# Patient Record
Sex: Male | Born: 1995 | Race: Black or African American | Hispanic: No | Marital: Married | Smoking: Current some day smoker
Health system: Southern US, Community
[De-identification: ages and names within clinical notes are randomized; demographics above are authoritative.]

## PROBLEM LIST (undated history)

## (undated) DIAGNOSIS — E119 Type 2 diabetes mellitus without complications: Secondary | ICD-10-CM

## (undated) DIAGNOSIS — N200 Calculus of kidney: Secondary | ICD-10-CM

## (undated) DIAGNOSIS — J45909 Unspecified asthma, uncomplicated: Secondary | ICD-10-CM

## (undated) HISTORY — DX: Type 2 diabetes mellitus without complications: E11.9

---
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
## 2013-01-04 NOTE — ED Provider Notes (Signed)
Medical screening examination/treatment/procedure(s) were performed by non-physician practitioner and as supervising physician I was immediately available for consultation/collaboration.    Jeannifer Drakeford R Kitrina Maurin, MD 01/04/13 1605 

## 2013-09-12 ENCOUNTER — Emergency Department (HOSPITAL_COMMUNITY): Payer: Medicaid Other

## 2013-09-12 ENCOUNTER — Emergency Department (HOSPITAL_COMMUNITY)
Admission: EM | Admit: 2013-09-12 | Discharge: 2013-09-12 | Disposition: A | Discharge status: Home / Self Care | Payer: Medicaid Other | Attending: Emergency Medicine | Admitting: Emergency Medicine

## 2013-09-12 ENCOUNTER — Encounter (HOSPITAL_COMMUNITY): Payer: Self-pay | Admitting: Emergency Medicine

## 2013-09-12 DIAGNOSIS — Y9361 Activity, american tackle football: Secondary | ICD-10-CM | POA: Diagnosis not present

## 2013-09-12 DIAGNOSIS — S99919A Unspecified injury of unspecified ankle, initial encounter: Secondary | ICD-10-CM | POA: Diagnosis present

## 2013-09-12 DIAGNOSIS — S99929A Unspecified injury of unspecified foot, initial encounter: Secondary | ICD-10-CM

## 2013-09-12 DIAGNOSIS — W03XXXA Other fall on same level due to collision with another person, initial encounter: Secondary | ICD-10-CM | POA: Insufficient documentation

## 2013-09-12 DIAGNOSIS — S93402A Sprain of unspecified ligament of left ankle, initial encounter: Secondary | ICD-10-CM

## 2013-09-12 DIAGNOSIS — S8990XA Unspecified injury of unspecified lower leg, initial encounter: Secondary | ICD-10-CM | POA: Diagnosis present

## 2013-09-12 DIAGNOSIS — Y92838 Other recreation area as the place of occurrence of the external cause: Secondary | ICD-10-CM

## 2013-09-12 DIAGNOSIS — S93409A Sprain of unspecified ligament of unspecified ankle, initial encounter: Secondary | ICD-10-CM | POA: Diagnosis not present

## 2013-09-12 DIAGNOSIS — X500XXA Overexertion from strenuous movement or load, initial encounter: Secondary | ICD-10-CM | POA: Insufficient documentation

## 2013-09-12 DIAGNOSIS — Y9239 Other specified sports and athletic area as the place of occurrence of the external cause: Secondary | ICD-10-CM | POA: Insufficient documentation

## 2013-09-12 DIAGNOSIS — J45909 Unspecified asthma, uncomplicated: Secondary | ICD-10-CM | POA: Diagnosis not present

## 2013-09-12 MED ORDER — IBUPROFEN 800 MG PO TABS: 800.0000 mg | ORAL_TABLET | Freq: Three times a day (TID) | ORAL | Status: DC

## 2013-09-12 MED ORDER — IBUPROFEN 400 MG PO TABS
800.0000 mg | ORAL_TABLET | Freq: Once | ORAL | Status: AC
  Administered 2013-09-12: 800 mg via ORAL
  Filled 2013-09-12: qty 4

## 2013-09-12 NOTE — Discharge Instructions (Signed)
Ankle Sprain  An ankle sprain is an injury to the strong, fibrous tissues (ligaments) that hold your ankle bones together.   HOME CARE   · Put ice on your ankle for 1-2 days or as told by your doctor.  ¨ Put ice in a plastic bag.  ¨ Place a towel between your skin and the bag.  ¨ Leave the ice on for 15-20 minutes at a time, every 2 hours while you are awake.  · Only take medicine as told by your doctor.  · Raise (elevate) your injured ankle above the level of your heart as much as possible for 2-3 days.  · Use crutches if your doctor tells you to. Slowly put your own weight on the affected ankle. Use the crutches until you can walk without pain.  · If you have a plaster splint:  ¨ Do not rest it on anything harder than a pillow for 24 hours.  ¨ Do not put weight on it.  ¨ Do not get it wet.  ¨ Take it off to shower or bathe.  · If given, use an elastic wrap or support stocking for support. Take the wrap off if your toes lose feeling (numb), tingle, or turn cold or blue.  · If you have an air splint:  ¨ Add or let out air to make it comfortable.  ¨ Take it off at night and to shower and bathe.  ¨ Wiggle your toes and move your ankle up and down often while you are wearing it.  GET HELP IF:  · You have rapidly increasing bruising or puffiness (swelling).  · Your toes feel very cold.  · You lose feeling in your foot.  · Your medicine does not help your pain.  GET HELP RIGHT AWAY IF:   · Your toes lose feeling (numb) or turn blue.  · You have severe pain that is increasing.  MAKE SURE YOU:   · Understand these instructions.  · Will watch your condition.  · Will get help right away if you are not doing well or get worse.  Document Released: 06/12/2007 Document Revised: 05/10/2013 Document Reviewed: 07/08/2011  ExitCare® Patient Information ©2015 [employer]. This information is not intended to replace advice given to you by your health care provider. Make sure you discuss any questions you have with your health care  provider.

## 2013-09-12 NOTE — ED Notes (Signed)
He was playing football this afternoon and got tackled and felt a pop and pain in his L ankle/foot. Bruising and swelling to L inner foot. States pain is worse with ambulation. Cms intact

## 2013-09-12 NOTE — ED Provider Notes (Signed)
CSN: [id number]     Arrival date & time 09/12/13  1759 History   First MD Initiated Contact with Patient 09/12/13 2029   This chart was scribed for non-physician practitioner Madelyn Flavors, PA-C,  working with Vanetta Mulders, MD by Gwenevere Abbot, ED scribe. This patient was seen in room TR10C/TR10C and the patient's care was started at 9:25 PM.    Chief Complaint  Patient presents with  . Foot Injury   The history is provided by the patient. No language interpreter was used.   HPI Comments:  Steven Barrera is a 18 y.o. male who presents to the Emergency Department complaining of a left foot and ankle injury. Pt reports that he was playing football, and that he was tackled. The player then fell on his foot, and he heard a pop in his ankle. Pt denies taking OTC medication PTA. Pt denies tingling or numbness, but is experiencing discoloration and swelling. Pt reports that he has previously injured the left foot. Pt reports that he is healthy. Pt denies allergy. All other ROS negative.  Past Medical History  Diagnosis Date  . Asthma    History reviewed. No pertinent past surgical history. History reviewed. No pertinent family history. History  Substance Use Topics  . Smoking status: Never Smoker   . Smokeless tobacco: Not on file  . Alcohol Use: No    Review of Systems  Musculoskeletal: Positive for arthralgias, joint swelling and myalgias.  All other systems reviewed and are negative.     Allergies  Shellfish allergy  Home Medications   Prior to Admission medications   Medication Sig Start Date End Date Taking? Authorizing Provider  ibuprofen (ADVIL,MOTRIN) 800 MG tablet Take 1 tablet (800 mg total) by mouth 3 (three) times daily. 09/12/13   Giana Castner A Forcucci, PA-C   BP 105/47  Pulse 84  Temp(Src) 98.2 F (36.8 C) (Oral)  Resp 18  SpO2 98% Physical Exam  Nursing note and vitals reviewed. Constitutional: He is oriented to person, place, and time. He appears  well-developed and well-nourished.  HENT:  Head: Normocephalic and atraumatic.  Mouth/Throat: Oropharynx is clear and moist. No oropharyngeal exudate.  Eyes: Conjunctivae and EOM are normal. Pupils are equal, round, and reactive to light. No scleral icterus.  Neck: Normal range of motion. Neck supple.  Cardiovascular: Normal rate, regular rhythm, normal heart sounds and intact distal pulses.  Exam reveals no gallop and no friction rub.   No murmur heard. Pulmonary/Chest: Effort normal and breath sounds normal. No respiratory distress. He has no wheezes. He has no rales. He exhibits no tenderness.  Musculoskeletal: Normal range of motion. He exhibits tenderness.       Left ankle: He exhibits normal range of motion, no swelling, no ecchymosis, no deformity, no laceration and normal pulse. Tenderness (ATF). AITFL tenderness found. No lateral malleolus, no medial malleolus, no CF ligament, no posterior TFL, no head of 5th metatarsal and no proximal fibula tenderness found. Achilles tendon normal.  Neurological: He is alert and oriented to person, place, and time.  Skin: Skin is warm and dry.  Psychiatric: He has a normal mood and affect. His behavior is normal. Judgment and thought content normal.    ED Course  Procedures  DIAGNOSTIC STUDIES: Oxygen Saturation is 98% on RA, normal by my interpretation.  COORDINATION OF CARE: 9:31 PM-Discussed treatment plan which includes ankle splint and crutches, along with anti- inflammation medication with pt at bedside and pt agreed to plan.   Labs Review Labs  Reviewed - No data to display  Imaging Review Dg Ankle Complete Left  09/12/2013   CLINICAL DATA:  Football injury.  Ankle pain.  EXAM: LEFT ANKLE COMPLETE - 3+ VIEW  COMPARISON:  01/23/2007  FINDINGS: There is no evidence of fracture, dislocation, or joint effusion. There is no evidence of arthropathy or other focal bone abnormality. Soft tissues are unremarkable.  IMPRESSION: Negative.    Electronically Signed   By: Myles Rosenthal M.D.   On: 09/12/2013 20:17   Dg Foot Complete Left  09/12/2013   CLINICAL DATA:  Injury.  EXAM: LEFT FOOT - COMPLETE 3+ VIEW  COMPARISON:  None.  FINDINGS: There is no evidence of fracture or dislocation. There is no evidence of arthropathy or other focal bone abnormality. Soft tissues are unremarkable.  IMPRESSION: Negative.   Electronically Signed   By: Elberta Fortis M.D.   On: 09/12/2013 20:16     EKG Interpretation None      MDM   Final diagnoses:  Left ankle sprain, initial encounter   Patient is a 18 y.o. Male who presents to the ED with left ankle pain after inversion.  Physical exam reveals ligamentous tenderness.  Plain films negative for fracture.  Will treat with naproxen BID.  Patient to be placed in ASO and given crutches to weight bear as tolerated.  Patient was told to follow-up with ortho.  Patient to return for compartment syndrome symptoms.  He states understanding and agreement at this time.   I personally performed the services described in this documentation, which was scribed in my presence. The recorded information has been reviewed and is accurate.     Eben Burow, PA-C 09/13/13 [phone]

## 2013-09-16 NOTE — ED Provider Notes (Signed)
Medical screening examination/treatment/procedure(s) were performed by non-physician practitioner and as supervising physician I was immediately available for consultation/collaboration.   EKG Interpretation None        Akela Pocius, MD 09/16/13 0735 

## 2014-06-03 ENCOUNTER — Encounter (HOSPITAL_COMMUNITY): Payer: Self-pay

## 2014-06-03 ENCOUNTER — Emergency Department (HOSPITAL_COMMUNITY)
Admission: EM | Admit: 2014-06-03 | Discharge: 2014-06-03 | Disposition: A | Discharge status: Home / Self Care | Payer: Medicaid Other | Attending: Emergency Medicine | Admitting: Emergency Medicine

## 2014-06-03 DIAGNOSIS — H578 Other specified disorders of eye and adnexa: Secondary | ICD-10-CM | POA: Diagnosis not present

## 2014-06-03 DIAGNOSIS — Z791 Long term (current) use of non-steroidal anti-inflammatories (NSAID): Secondary | ICD-10-CM | POA: Insufficient documentation

## 2014-06-03 DIAGNOSIS — J45901 Unspecified asthma with (acute) exacerbation: Secondary | ICD-10-CM | POA: Diagnosis not present

## 2014-06-03 DIAGNOSIS — H938X1 Other specified disorders of right ear: Secondary | ICD-10-CM | POA: Insufficient documentation

## 2014-06-03 DIAGNOSIS — B9789 Other viral agents as the cause of diseases classified elsewhere: Secondary | ICD-10-CM

## 2014-06-03 DIAGNOSIS — J069 Acute upper respiratory infection, unspecified: Secondary | ICD-10-CM | POA: Insufficient documentation

## 2014-06-03 DIAGNOSIS — R05 Cough: Secondary | ICD-10-CM | POA: Diagnosis present

## 2014-06-03 DIAGNOSIS — H109 Unspecified conjunctivitis: Secondary | ICD-10-CM | POA: Diagnosis not present

## 2014-06-03 MED ORDER — POLYMYXIN B-TRIMETHOPRIM 10000-0.1 UNIT/ML-% OP SOLN: 1.0000 [drp] | OPHTHALMIC | Status: DC

## 2014-06-03 MED ORDER — PSEUDOEPH-BROMPHEN-DM 30-2-10 MG/5ML PO SYRP: 5.0000 mL | ORAL_SOLUTION | Freq: Three times a day (TID) | ORAL | Status: DC | PRN

## 2014-06-03 NOTE — ED Notes (Signed)
Pt called x2. No answer.  

## 2014-06-03 NOTE — ED Notes (Signed)
Pt reports he has been coughing up a lot of yellow phlegm since Sunday.

## 2014-06-03 NOTE — ED Notes (Signed)
The pt states he did not hear us calling his name for triage earlier and returned to nurse first desk asking about the wait. Staff called the patient multiple times and did not see him in the waiting room.

## 2014-06-03 NOTE — ED Notes (Signed)
Called for triage no response 

## 2014-06-03 NOTE — Discharge Instructions (Signed)
Upper Respiratory Infection, Adult °An upper respiratory infection (URI) is also sometimes known as the common cold. The upper respiratory tract includes the nose, sinuses, throat, trachea, and bronchi. Bronchi are the airways leading to the lungs. Most people improve within 1 week, but symptoms can last up to 2 weeks. A residual cough may last even longer.  °CAUSES °Many different viruses can infect the tissues lining the upper respiratory tract. The tissues become irritated and inflamed and often become very moist. Mucus production is also common. A cold is contagious. You can easily spread the virus to others by oral contact. This includes kissing, sharing a glass, coughing, or sneezing. Touching your mouth or nose and then touching a surface, which is then touched by another person, can also spread the virus. °SYMPTOMS  °Symptoms typically develop 1 to 3 days after you come in contact with a cold virus. Symptoms vary from person to person. They may include: °· Runny nose. °· Sneezing. °· Nasal congestion. °· Sinus irritation. °· Sore throat. °· Loss of voice (laryngitis). °· Cough. °· Fatigue. °· Muscle aches. °· Loss of appetite. °· Headache. °· Low-grade fever. °DIAGNOSIS  °You might diagnose your own cold based on familiar symptoms, since most people get a cold 2 to 3 times a year. Your caregiver can confirm this based on your exam. Most importantly, your caregiver can check that your symptoms are not due to another disease such as strep throat, sinusitis, pneumonia, asthma, or epiglottitis. Blood tests, throat tests, and X-rays are not necessary to diagnose a common cold, but they may sometimes be helpful in excluding other more serious diseases. Your caregiver will decide if any further tests are required. °RISKS AND COMPLICATIONS  °You may be at risk for a more severe case of the common cold if you smoke cigarettes, have chronic heart disease (such as heart failure) or lung disease (such as asthma), or if  you have a weakened immune system. The very young and very old are also at risk for more serious infections. Bacterial sinusitis, middle ear infections, and bacterial pneumonia can complicate the common cold. The common cold can worsen asthma and chronic obstructive pulmonary disease (COPD). Sometimes, these complications can require emergency medical care and may be life-threatening. °PREVENTION  °The best way to protect against getting a cold is to practice good hygiene. Avoid oral or hand contact with people with cold symptoms. Wash your hands often if contact occurs. There is no clear evidence that vitamin C, vitamin E, echinacea, or exercise reduces the chance of developing a cold. However, it is always recommended to get plenty of rest and practice good nutrition. °TREATMENT  °Treatment is directed at relieving symptoms. There is no cure. Antibiotics are not effective, because the infection is caused by a virus, not by bacteria. Treatment may include: °· Increased fluid intake. Sports drinks offer valuable electrolytes, sugars, and fluids. °· Breathing heated mist or steam (vaporizer or shower). °· Eating chicken soup or other clear broths, and maintaining good nutrition. °· Getting plenty of rest. °· Using gargles or lozenges for comfort. °· Controlling fevers with ibuprofen or acetaminophen as directed by your caregiver. °· Increasing usage of your inhaler if you have asthma. °Zinc gel and zinc lozenges, taken in the first 24 hours of the common cold, can shorten the duration and lessen the severity of symptoms. Pain medicines may help with fever, muscle aches, and throat pain. A variety of non-prescription medicines are available to treat congestion and runny nose. Your caregiver   can make recommendations and may suggest nasal or lung inhalers for other symptoms.  HOME CARE INSTRUCTIONS   Only take over-the-counter or prescription medicines for pain, discomfort, or fever as directed by your  caregiver.  Use a warm mist humidifier or inhale steam from a shower to increase air moisture. This may keep secretions moist and make it easier to breathe.  Drink enough water and fluids to keep your urine clear or pale yellow.  Rest as needed.  Return to work when your temperature has returned to normal or as your caregiver advises. You may need to stay home longer to avoid infecting others. You can also use a face mask and careful hand washing to prevent spread of the virus. SEEK MEDICAL CARE IF:   After the first few days, you feel you are getting worse rather than better.  You need your caregiver's advice about medicines to control symptoms.  You develop chills, worsening shortness of breath, or brown or red sputum. These may be signs of pneumonia.  You develop yellow or brown nasal discharge or pain in the face, especially when you bend forward. These may be signs of sinusitis.  You develop a fever, swollen neck glands, pain with swallowing, or white areas in the back of your throat. These may be signs of strep throat. SEEK IMMEDIATE MEDICAL CARE IF:   You have a fever.  You develop severe or persistent headache, ear pain, sinus pain, or chest pain.  You develop wheezing, a prolonged cough, cough up blood, or have a change in your usual mucus (if you have chronic lung disease).  You develop sore muscles or a stiff neck. Document Released: 06/19/2000 Document Revised: 03/18/2011 Document Reviewed: 03/31/2013 [facility] Patient Information 2015 [place], [place]. This information is not intended to replace advice given to you by your health care provider. Make sure you discuss any questions you have with your health care provider. Bacterial Conjunctivitis Bacterial conjunctivitis (commonly called pink eye) is redness, soreness, or puffiness (inflammation) of the white part of your eye. It is caused by a germ called bacteria. These germs can easily spread from person to person  (contagious). Your eye often will become red or pink. Your eye may also become irritated, watery, or have a thick discharge.  HOME CARE   Apply a cool, clean washcloth over closed eyelids. Do this for 10-20 minutes, 3-4 times a day while you have pain.  Gently wipe away any fluid coming from the eye with a warm, wet washcloth or cotton ball.  Wash your hands often with soap and water. Use paper towels to dry your hands.  Do not share towels or washcloths.  Change or wash your pillowcase every day.  Do not use eye makeup until the infection is gone.  Do not use machines or drive if your vision is blurry.  Stop using contact lenses. Do not use them again until your doctor says it is okay.  Do not touch the tip of the eye drop bottle or medicine tube with your fingers when you put medicine on the eye. GET HELP RIGHT AWAY IF:   Your eye is not better after 3 days of starting your medicine.  You have a yellowish fluid coming out of the eye.  You have more pain in the eye.  Your eye redness is spreading.  Your vision becomes blurry.  You have a fever or lasting symptoms for more than 2-3 days.  You have a fever and your symptoms suddenly get worse.  You have pain in the face.  Your face gets red or puffy (swollen). MAKE SURE YOU:   Understand these instructions.  Will watch this condition.  Will get help right away if you are not doing well or get worse. Document Released: 10/03/2007 Document Revised: 12/11/2011 Document Reviewed: 08/30/2011 [facility] Patient Information 2015 [place], [place]. This information is not intended to replace advice given to you by your health care provider. Make sure you discuss any questions you have with your health care provider.

## 2014-06-03 NOTE — ED Provider Notes (Signed)
CSN: [id number]     Arrival date & time 06/03/14  1330 History  This chart was scribed for non-physician practitioner Felicie Morn, NP working with Tilden Fossa, MD by Lyndel Safe, ED Scribe. This patient was seen in room TR05C/TR05C and the patient's care was started at 6:18 PM.    Chief Complaint  Patient presents with  . Cough   Patient is a 19 y.o. male presenting with cough. The history is provided by the patient. No language interpreter was used.  Cough Cough characteristics:  Productive Sputum characteristics:  Clear Severity:  Moderate Onset quality:  Sudden Duration:  5 days Timing:  Constant Progression:  Unchanged Chronicity:  New Smoker: no   Relieved by:  Nothing Worsened by:  Nothing tried Ineffective treatments:  Decongestant and cough suppressants Associated symptoms: ear fullness   Associated symptoms: no chills, no ear pain, no fever, no rhinorrhea and no sore throat     HPI Comments: Steven Barrera is a 19 y.o. male, with a PMhx of asthma, who presents to the Emergency Department complaining of a constant, moderate productive cough with clear sputum onset 5 days ago. He reports associated nasal congestion, mild wheezing, and a fullness in his right ear. He states that his sputum started a yellow color but is currently clear. He also notes an associated sore throat and rhinorrhea in the beginning but these symptoms have resolved. He states he has used Nyquil and sinus medication at home with no relief. He denies abdominal pain, nausea, diarrhea, ear pain, vomiting, fever, or chills.  He also complains of a red, right eye with clear drainage. He reports that he does not see a PCP.  Past Medical History  Diagnosis Date  . Asthma    History reviewed. No pertinent past surgical history. No family history on file. History  Substance Use Topics  . Smoking status: Never Smoker   . Smokeless tobacco: Not on file  . Alcohol Use: No    Review of Systems   Constitutional: Negative for fever and chills.  HENT: Positive for congestion. Negative for ear pain, rhinorrhea and sore throat.   Respiratory: Positive for cough.   Gastrointestinal: Negative for nausea, vomiting, abdominal pain and diarrhea.  All other systems reviewed and are negative.   Allergies  Shellfish allergy  Home Medications   Prior to Admission medications   Medication Sig Start Date End Date Taking? Authorizing Provider  ibuprofen (ADVIL,MOTRIN) 800 MG tablet Take 1 tablet (800 mg total) by mouth 3 (three) times daily. 09/12/13   Courtney Forcucci, PA-C   BP 144/76 mmHg  Pulse 88  Temp(Src) 97.6 F (36.4 C)  Resp 18  Ht  (1.778 m)  Wt 260 lb (117.935 kg)  BMI 37.31 kg/m2  SpO2 96%   Physical Exam  Constitutional: He is oriented to person, place, and time. He appears well-developed and well-nourished. No distress.  HENT:  Head: Normocephalic and atraumatic.  Left Ear: External ear normal.  Right TM mild bulging with erythema   Eyes: EOM are normal. Pupils are equal, round, and reactive to light. Right eye exhibits discharge. Right conjunctiva is injected.  Neck: Neck supple.  Cardiovascular: Normal rate, regular rhythm and normal heart sounds.   Pulmonary/Chest: Effort normal and breath sounds normal. No respiratory distress.  Lymphadenopathy:    He has no cervical adenopathy.  Neurological: He is alert and oriented to person, place, and time.  Skin: Skin is warm.  Psychiatric: His behavior is normal.  Nursing note and  vitals reviewed.   ED Course  Procedures  DIAGNOSTIC STUDIES: Oxygen Saturation is 96% on RA, adequate by my interpretation.    COORDINATION OF CARE: 6:21 PM Discussed to drink fluids and take NSAID over the next couple of days with pt. Pt acknowledges and agrees to plan.   Labs Review Labs Reviewed - No data to display  Imaging Review No results found.   EKG Interpretation None      MDM   Final diagnoses:  None    Viral URI with cough.  Bromfed-DM. Conjunctivitis right eye--polytrim.  Return precautions discussed.  I personally performed the services described in this documentation, which was scribed in my presence. The recorded information has been reviewed and is accurate.    Felicie Mornavid Hansel Devan, NP 06/03/14 1907  Tilden FossaElizabeth Rees, MD 06/03/14 [phone]

## 2014-06-03 NOTE — ED Notes (Signed)
Pt calledc x3. No answer

## 2014-06-10 ENCOUNTER — Emergency Department (HOSPITAL_COMMUNITY)
Admission: EM | Admit: 2014-06-10 | Discharge: 2014-06-10 | Disposition: A | Discharge status: Home / Self Care | Payer: Medicaid Other | Attending: Emergency Medicine | Admitting: Emergency Medicine

## 2014-06-10 ENCOUNTER — Encounter (HOSPITAL_COMMUNITY): Payer: Self-pay | Admitting: Emergency Medicine

## 2014-06-10 DIAGNOSIS — J45909 Unspecified asthma, uncomplicated: Secondary | ICD-10-CM | POA: Insufficient documentation

## 2014-06-10 DIAGNOSIS — H109 Unspecified conjunctivitis: Secondary | ICD-10-CM | POA: Insufficient documentation

## 2014-06-10 DIAGNOSIS — H5711 Ocular pain, right eye: Secondary | ICD-10-CM | POA: Diagnosis present

## 2014-06-10 MED ORDER — FLUORESCEIN SODIUM 1 MG OP STRP
1.0000 | ORAL_STRIP | Freq: Once | OPHTHALMIC | Status: AC
  Administered 2014-06-10: 1 via OPHTHALMIC
  Filled 2014-06-10: qty 1

## 2014-06-10 MED ORDER — KETOROLAC TROMETHAMINE 0.5 % OP SOLN: 1.0000 [drp] | Freq: Four times a day (QID) | OPHTHALMIC | Status: AC

## 2014-06-10 MED ORDER — TOBRAMYCIN-DEXAMETHASONE 0.3-0.1 % OP OINT: 1.0000 "application " | TOPICAL_OINTMENT | Freq: Three times a day (TID) | OPHTHALMIC | Status: AC

## 2014-06-10 MED ORDER — TETRACAINE HCL 0.5 % OP SOLN
2.0000 [drp] | Freq: Once | OPHTHALMIC | Status: AC
  Administered 2014-06-10: 2 [drp] via OPHTHALMIC
  Filled 2014-06-10: qty 2

## 2014-06-10 NOTE — Discharge Instructions (Signed)

## 2014-06-10 NOTE — ED Provider Notes (Signed)
CSN: [id number]     Arrival date & time 06/10/14  1604 History  This chart was scribed for Steven HelperBowie Steven Willow, PA-C working with Linwood DibblesJon Knapp, MD by Evon Slackerrance Branch, ED Scribe. This patient was seen in room TR04C/TR04C and the patient's care was started at 5:36 PM.     Chief Complaint  Patient presents with  . Eye Pain    The history is provided by the patient. No language interpreter was used.   HPI Comments: Steven Barrera is a 19 y.o. male who presents to the Emergency Department complaining of right eye pain onset 12 days prior. Pt rates the severity of his pain 8/10. Pt states that initially he had URI symptoms (cough and rhinorrhea) whe his eye pain began 12 days ago that have since resolved. Pt states he was evaluated in the ED and prescribed Polytrim eye drops that hasn't provided any relief. Pt states that applying a warm compress provides temporary relief. Pt presents with associated right eye redness, photophobia, right eye discharge. Pt states that the discharge is worse in the morning when waking up. He denies injury or trauma to the eye. Pt denies contact use. 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 Denies fever, vomiting.     Past Medical History  Diagnosis Date  . Asthma    History reviewed. No pertinent past surgical history. No family history on file. History  Substance Use Topics  . Smoking status: Never Smoker   . Smokeless tobacco: Not on file  . Alcohol Use: No    Review of Systems  Constitutional: Negative for fever.  Eyes: Positive for photophobia, pain, discharge, redness and visual disturbance.  Gastrointestinal: Negative for vomiting.      Allergies  Shellfish allergy  Home Medications   Prior to Admission medications   Medication Sig Start Date End Date Taking? Authorizing Provider  brompheniramine-pseudoephedrine-DM 30-2-10 MG/5ML syrup Take 5 mLs by mouth 3 (three) times daily as needed. 06/03/14  Yes Felicie Mornavid  Smith, NP  trimethoprim-polymyxin b (POLYTRIM) ophthalmic solution Place 1 drop into the right eye every 4 (four) hours. 06/03/14  Yes Felicie Mornavid Smith, NP   BP 121/50 mmHg  Pulse 80  Temp(Src) 98.3 F (36.8 C) (Oral)  Resp 20  Wt 318 lb 9.6 oz (144.516 kg)  SpO2 97%   Physical Exam  Constitutional: He is oriented to person, place, and time. He appears well-developed and well-nourished. No distress.  HENT:  Head: Normocephalic and atraumatic.  Eyes: EOM are normal. Pupils are equal, round, and reactive to light. No foreign body present in the right eye. Left eye exhibits discharge. Left eye exhibits no chemosis, no exudate and no hordeolum. No foreign body present in the left eye. Right conjunctiva is injected. Right conjunctiva has no hemorrhage. No scleral icterus.  Slit lamp exam:      The right eye shows no corneal abrasion, no corneal flare, no corneal ulcer, no foreign body, no hyphema, no hypopyon and no fluorescein uptake.  Right eye: Conjunctiva and sclera is markedly injected limbic sparing.  No consensual eye pain.   Neck: Neck supple. No tracheal deviation present.  Cardiovascular: Normal rate.   Pulmonary/Chest: Effort normal. No respiratory distress.  Musculoskeletal: Normal range of motion.  Neurological: He is alert and oriented to person, place, and time.  Skin: Skin is warm and dry.  Psychiatric: He has a normal mood and affect. His behavior is normal.  Nursing note and vitals reviewed.   ED  Course  Procedures (including critical care time) DIAGNOSTIC STUDIES: Oxygen Saturation is 97% on RA, normal by my interpretation.    COORDINATION OF CARE: 5:41 PM-Discussed treatment plan with pt at bedside and pt agreed to plan.   Patient with evidence of conjunctivitis to right eye only. Report eye pain which subsequently resolved after receiving tetracaine eyedrops. No significant finding aside from a injected conjunctiva. Patient has normal intraocular pressure of 19 and 12  respectively. Normal visual acuity. No evidence to suggest acute angle glaucoma uveitis or iritis. Patient reported no significant improvement after using Polytrim. I suspect that this could be a viral conjunctivitis however I did switch his eyedrops to Tobrex and prescribed ketorolac eyedrops for pain management. Eyepatch giving for comfort. Ophthalmologic referral given as needed. Return precautions discussed.    Labs Review Labs Reviewed - No data to display  Imaging Review No results found.   EKG Interpretation None      MDM   Final diagnoses:  Conjunctivitis of right eye    BP 121/50 mmHg  Pulse 80  Temp(Src) 98.3 F (36.8 C) (Oral)  Resp 20  Wt 318 lb 9.6 oz (144.516 kg)  SpO2 97%    I personally performed the services described in this documentation, which was scribed in my presence. The recorded information has been reviewed and is accurate.      Steven Helper, PA-C 06/10/14 1845  Linwood Dibbles, MD 06/10/14 2155

## 2014-06-10 NOTE — ED Notes (Signed)
Pt st's he was seen here and tx for pink eye on 5/27.  Pt st's he has been using the gtts but right eye feels like it is getting worse

## 2014-07-31 ENCOUNTER — Emergency Department (HOSPITAL_COMMUNITY): Payer: Medicaid Other | Admitting: Anesthesiology

## 2014-07-31 ENCOUNTER — Encounter (HOSPITAL_COMMUNITY)
Admission: EM | Disposition: A | Discharge status: Home / Self Care | Payer: Self-pay | Source: Home / Self Care | Attending: Emergency Medicine

## 2014-07-31 ENCOUNTER — Ambulatory Visit (HOSPITAL_COMMUNITY)
Admission: EM | Admit: 2014-07-31 | Discharge: 2014-07-31 | Disposition: A | Discharge status: Home / Self Care | Payer: Medicaid Other | Attending: Emergency Medicine | Admitting: Emergency Medicine

## 2014-07-31 ENCOUNTER — Emergency Department (HOSPITAL_COMMUNITY): Payer: Medicaid Other

## 2014-07-31 ENCOUNTER — Encounter (HOSPITAL_COMMUNITY): Payer: Self-pay | Admitting: Emergency Medicine

## 2014-07-31 DIAGNOSIS — J45909 Unspecified asthma, uncomplicated: Secondary | ICD-10-CM | POA: Insufficient documentation

## 2014-07-31 DIAGNOSIS — S66320A Laceration of extensor muscle, fascia and tendon of right index finger at wrist and hand level, initial encounter: Secondary | ICD-10-CM | POA: Diagnosis not present

## 2014-07-31 DIAGNOSIS — S61210A Laceration without foreign body of right index finger without damage to nail, initial encounter: Secondary | ICD-10-CM | POA: Diagnosis present

## 2014-07-31 DIAGNOSIS — W25XXXA Contact with sharp glass, initial encounter: Secondary | ICD-10-CM | POA: Diagnosis not present

## 2014-07-31 DIAGNOSIS — S66921A Laceration of unspecified muscle, fascia and tendon at wrist and hand level, right hand, initial encounter: Secondary | ICD-10-CM

## 2014-07-31 DIAGNOSIS — S61411A Laceration without foreign body of right hand, initial encounter: Secondary | ICD-10-CM

## 2014-07-31 HISTORY — PX: TENDON REPAIR: SHX5111

## 2014-07-31 LAB — I-STAT CHEM 8, ED
BUN: 14 mg/dL (ref 6–20)
CHLORIDE: 106 mmol/L (ref 101–111)
Calcium, Ion: 1.2 mmol/L (ref 1.12–1.23)
Creatinine, Ser: 1 mg/dL (ref 0.61–1.24)
Glucose, Bld: 86 mg/dL (ref 65–99)
HEMATOCRIT: 41 % (ref 39.0–52.0)
Hemoglobin: 13.9 g/dL (ref 13.0–17.0)
POTASSIUM: 4 mmol/L (ref 3.5–5.1)
Sodium: 143 mmol/L (ref 135–145)
TCO2: 21 mmol/L (ref 0–100)

## 2014-07-31 LAB — CBC WITH DIFFERENTIAL/PLATELET
BASOS PCT: 0 % (ref 0–1)
Basophils Absolute: 0 10*3/uL (ref 0.0–0.1)
EOS ABS: 0.1 10*3/uL (ref 0.0–0.7)
EOS PCT: 3 % (ref 0–5)
HEMATOCRIT: 38.7 % — AB (ref 39.0–52.0)
HEMOGLOBIN: 13.4 g/dL (ref 13.0–17.0)
Lymphocytes Relative: 49 % — ABNORMAL HIGH (ref 12–46)
Lymphs Abs: 2.3 10*3/uL (ref 0.7–4.0)
MCH: 29.6 pg (ref 26.0–34.0)
MCHC: 34.6 g/dL (ref 30.0–36.0)
MCV: 85.4 fL (ref 78.0–100.0)
Monocytes Absolute: 0.4 10*3/uL (ref 0.1–1.0)
Monocytes Relative: 9 % (ref 3–12)
NEUTROS PCT: 39 % — AB (ref 43–77)
Neutro Abs: 1.8 10*3/uL (ref 1.7–7.7)
PLATELETS: 270 10*3/uL (ref 150–400)
RBC: 4.53 MIL/uL (ref 4.22–5.81)
RDW: 13 % (ref 11.5–15.5)
WBC: 4.6 10*3/uL (ref 4.0–10.5)

## 2014-07-31 SURGERY — TENDON REPAIR
Anesthesia: General | Site: Hand | Laterality: Right

## 2014-07-31 MED ORDER — PROPOFOL 10 MG/ML IV BOLUS
INTRAVENOUS | Status: DC | PRN
  Administered 2014-07-31: 250 mg via INTRAVENOUS

## 2014-07-31 MED ORDER — FENTANYL CITRATE (PF) 250 MCG/5ML IJ SOLN
INTRAMUSCULAR | Status: AC
  Filled 2014-07-31: qty 5

## 2014-07-31 MED ORDER — LIDOCAINE HCL (CARDIAC) 20 MG/ML IV SOLN
INTRAVENOUS | Status: DC | PRN
  Administered 2014-07-31: 50 mg via INTRAVENOUS

## 2014-07-31 MED ORDER — MIDAZOLAM HCL 5 MG/5ML IJ SOLN
INTRAMUSCULAR | Status: DC | PRN
  Administered 2014-07-31: 2 mg via INTRAVENOUS

## 2014-07-31 MED ORDER — LACTATED RINGERS IV SOLN
INTRAVENOUS | Status: DC | PRN
  Administered 2014-07-31: 08:00:00 via INTRAVENOUS

## 2014-07-31 MED ORDER — SUCCINYLCHOLINE CHLORIDE 20 MG/ML IJ SOLN
INTRAMUSCULAR | Status: DC | PRN
  Administered 2014-07-31: 120 mg via INTRAVENOUS

## 2014-07-31 MED ORDER — CEFAZOLIN SODIUM-DEXTROSE 2-3 GM-% IV SOLR
INTRAVENOUS | Status: AC
  Administered 2014-07-31: 2 g via INTRAVENOUS
  Filled 2014-07-31: qty 50

## 2014-07-31 MED ORDER — BUPIVACAINE HCL (PF) 0.25 % IJ SOLN
INTRAMUSCULAR | Status: DC | PRN
  Administered 2014-07-31: 10 mL

## 2014-07-31 MED ORDER — PROMETHAZINE HCL 25 MG/ML IJ SOLN: 6.2500 mg | INTRAMUSCULAR | Status: DC | PRN

## 2014-07-31 MED ORDER — DEXAMETHASONE SODIUM PHOSPHATE 10 MG/ML IJ SOLN
INTRAMUSCULAR | Status: AC
  Filled 2014-07-31: qty 1

## 2014-07-31 MED ORDER — ONDANSETRON HCL 4 MG/2ML IJ SOLN
INTRAMUSCULAR | Status: DC | PRN
Start: 2014-07-31 — End: 2014-07-31
  Administered 2014-07-31: 4 mg via INTRAVENOUS

## 2014-07-31 MED ORDER — HYDROMORPHONE HCL 1 MG/ML IJ SOLN
INTRAMUSCULAR | Status: AC
  Filled 2014-07-31: qty 1

## 2014-07-31 MED ORDER — HYDROMORPHONE HCL 1 MG/ML IJ SOLN
INTRAMUSCULAR | Status: AC
  Administered 2014-07-31: 0.5 mg via INTRAVENOUS
  Filled 2014-07-31: qty 1

## 2014-07-31 MED ORDER — MIDAZOLAM HCL 2 MG/2ML IJ SOLN
INTRAMUSCULAR | Status: AC
  Filled 2014-07-31: qty 2

## 2014-07-31 MED ORDER — BUPIVACAINE HCL (PF) 0.25 % IJ SOLN
INTRAMUSCULAR | Status: AC
  Filled 2014-07-31: qty 30

## 2014-07-31 MED ORDER — LIDOCAINE-EPINEPHRINE (PF) 2 %-1:200000 IJ SOLN
10.0000 mL | Freq: Once | INTRAMUSCULAR | Status: AC
  Administered 2014-07-31: 10 mL via INTRADERMAL
  Filled 2014-07-31: qty 20

## 2014-07-31 MED ORDER — TRAMADOL HCL 50 MG PO TABS: 50.0000 mg | ORAL_TABLET | Freq: Four times a day (QID) | ORAL | Status: DC | PRN

## 2014-07-31 MED ORDER — FENTANYL CITRATE (PF) 250 MCG/5ML IJ SOLN
INTRAMUSCULAR | Status: DC | PRN
Start: 2014-07-31 — End: 2014-07-31
  Administered 2014-07-31 (×5): 50 ug via INTRAVENOUS

## 2014-07-31 MED ORDER — 0.9 % SODIUM CHLORIDE (POUR BTL) OPTIME
TOPICAL | Status: DC | PRN
  Administered 2014-07-31: 1000 mL

## 2014-07-31 MED ORDER — HYDROMORPHONE HCL 1 MG/ML IJ SOLN
0.2500 mg | INTRAMUSCULAR | Status: DC | PRN
  Administered 2014-07-31 (×3): 0.5 mg via INTRAVENOUS

## 2014-07-31 MED ORDER — DEXAMETHASONE SODIUM PHOSPHATE 10 MG/ML IJ SOLN
INTRAMUSCULAR | Status: DC | PRN
  Administered 2014-07-31: 10 mg via INTRAVENOUS

## 2014-07-31 MED ORDER — CEPHALEXIN 500 MG PO CAPS: 500.0000 mg | ORAL_CAPSULE | Freq: Four times a day (QID) | ORAL | Status: DC

## 2014-07-31 MED ORDER — PROPOFOL 10 MG/ML IV BOLUS
INTRAVENOUS | Status: AC
  Filled 2014-07-31: qty 20

## 2014-07-31 SURGICAL SUPPLY — 57 items
BANDAGE ELASTIC 3 VELCRO ST LF (GAUZE/BANDAGES/DRESSINGS) ×3 IMPLANT
BANDAGE ELASTIC 4 VELCRO ST LF (GAUZE/BANDAGES/DRESSINGS) IMPLANT
BNDG CMPR MD 5X2 ELC HKLP STRL (GAUZE/BANDAGES/DRESSINGS) ×2
BNDG COHESIVE 1X5 TAN STRL LF (GAUZE/BANDAGES/DRESSINGS) IMPLANT
BNDG ELASTIC 2 VLCR STRL LF (GAUZE/BANDAGES/DRESSINGS) ×3 IMPLANT
BNDG GAUZE ELAST 4 BULKY (GAUZE/BANDAGES/DRESSINGS) ×3 IMPLANT
CORDS BIPOLAR (ELECTRODE) ×4 IMPLANT
COVER SURGICAL LIGHT HANDLE (MISCELLANEOUS) ×4 IMPLANT
CUFF TOURNIQUET SINGLE 18IN (TOURNIQUET CUFF) ×3 IMPLANT
CUFF TOURNIQUET SINGLE 24IN (TOURNIQUET CUFF) IMPLANT
DECANTER SPIKE VIAL GLASS SM (MISCELLANEOUS) ×4 IMPLANT
DRAPE OEC MINIVIEW 54X84 (DRAPES) IMPLANT
DRAPE SURG 17X23 STRL (DRAPES) ×4 IMPLANT
GAUZE SPONGE 2X2 8PLY STRL LF (GAUZE/BANDAGES/DRESSINGS) IMPLANT
GAUZE SPONGE 4X4 12PLY STRL (GAUZE/BANDAGES/DRESSINGS) IMPLANT
GAUZE XEROFORM 1X8 LF (GAUZE/BANDAGES/DRESSINGS) ×3 IMPLANT
GLOVE BIO SURGEON STRL SZ7 (GLOVE) ×6 IMPLANT
GLOVE BIOGEL M STRL SZ7.5 (GLOVE) ×4 IMPLANT
GLOVE BIOGEL PI IND STRL 7.0 (GLOVE) ×2 IMPLANT
GLOVE BIOGEL PI INDICATOR 7.0 (GLOVE) ×4
GOWN STRL REUS W/ TWL LRG LVL3 (GOWN DISPOSABLE) ×2 IMPLANT
GOWN STRL REUS W/ TWL XL LVL3 (GOWN DISPOSABLE) ×4 IMPLANT
GOWN STRL REUS W/TWL LRG LVL3 (GOWN DISPOSABLE)
GOWN STRL REUS W/TWL XL LVL3 (GOWN DISPOSABLE) ×12
KIT BASIN OR (CUSTOM PROCEDURE TRAY) ×4 IMPLANT
KIT ROOM TURNOVER OR (KITS) ×4 IMPLANT
MANIFOLD NEPTUNE II (INSTRUMENTS) ×1 IMPLANT
NDL HYPO 25GX1X1/2 BEV (NEEDLE) IMPLANT
NEEDLE HYPO 25GX1X1/2 BEV (NEEDLE) ×4 IMPLANT
NS IRRIG 1000ML POUR BTL (IV SOLUTION) ×4 IMPLANT
PACK ORTHO EXTREMITY (CUSTOM PROCEDURE TRAY) ×4 IMPLANT
PAD ARMBOARD 7.5X6 YLW CONV (MISCELLANEOUS) ×8 IMPLANT
PAD CAST 3X4 CTTN HI CHSV (CAST SUPPLIES) ×1 IMPLANT
PAD CAST 4YDX4 CTTN HI CHSV (CAST SUPPLIES) ×1 IMPLANT
PADDING CAST COTTON 3X4 STRL (CAST SUPPLIES) ×4
PADDING CAST COTTON 4X4 STRL (CAST SUPPLIES) ×4
SOAP 2 % CHG 4 OZ (WOUND CARE) ×4 IMPLANT
SPECIMEN JAR SMALL (MISCELLANEOUS) ×1 IMPLANT
SPLINT FIBERGLASS 3X12 (CAST SUPPLIES) ×3 IMPLANT
SPONGE GAUZE 2X2 STER 10/PKG (GAUZE/BANDAGES/DRESSINGS)
SPONGE GAUZE 4X4 12PLY STER LF (GAUZE/BANDAGES/DRESSINGS) ×3 IMPLANT
SPONGE SCRUB IODOPHOR (GAUZE/BANDAGES/DRESSINGS) ×4 IMPLANT
SUCTION FRAZIER TIP 10 FR DISP (SUCTIONS) IMPLANT
SUT ETHILON 5 0 PS 2 18 (SUTURE) ×6 IMPLANT
SUT FIBERWIRE 4-0 18 TAPR NDL (SUTURE) ×4
SUT MERSILENE 4 0 P 3 (SUTURE) IMPLANT
SUT PROLENE 4 0 PS 2 18 (SUTURE) IMPLANT
SUT VIC AB 2-0 CT1 27 (SUTURE)
SUT VIC AB 2-0 CT1 TAPERPNT 27 (SUTURE) IMPLANT
SUTURE FIBERWR 4-0 18 TAPR NDL (SUTURE) ×1 IMPLANT
SYR CONTROL 10ML LL (SYRINGE) ×3 IMPLANT
TOWEL OR 17X24 6PK STRL BLUE (TOWEL DISPOSABLE) ×4 IMPLANT
TOWEL OR 17X26 10 PK STRL BLUE (TOWEL DISPOSABLE) ×4 IMPLANT
TUBE CONNECTING 12'X1/4 (SUCTIONS) ×1
TUBE CONNECTING 12X1/4 (SUCTIONS) ×2 IMPLANT
UNDERPAD 30X30 INCONTINENT (UNDERPADS AND DIAPERS) ×4 IMPLANT
WATER STERILE IRR 1000ML POUR (IV SOLUTION) ×1 IMPLANT

## 2014-07-31 NOTE — Op Note (Signed)
NAME:  Steven Barrera, Steven Barrera NO.:  [id number]  MEDICAL RECORD NO.:  [id number]  LOCATION:  [facility]                         FACILITY:  [facility]  PHYSICIAN:  Johnette Abraham, MD    DATE OF BIRTH:  15-Mar-1995  DATE OF PROCEDURE:  07/31/2014 DATE OF DISCHARGE:  07/31/2014                              OPERATIVE REPORT   PREOPERATIVE DIAGNOSIS:  Complex laceration and presumed extensor tendon laceration of the right index finger.  POSTOPERATIVE DIAGNOSIS:  Complex laceration and presumed extensor tendon laceration of the right index finger.  PROCEDURE: 1. Exploration of the complex wound of the right index finger. 2. Repair of extensor tendon of the right index finger. 3. Repair of joint capsule of the right index finger     metacarpophalangeal joint.  INDICATIONS:  [name] is a 20 year old male, who lacerated his hand after striking a glass object, presented to the emergency room with laceration and inability to extend his index finger.  I was consulted for definitive repair.  Risks, benefits, and alternatives of the operative exploration and possible tendon repair were discussed with him, he agreed with this plan of action and consent was obtained.  PROCEDURE IN DETAIL:  The patient was taken to the operating room, placed supine on the operating room table.  General anesthesia was administered without difficulty.  A time-out was performed.  The right upper extremity was prepped and draped in normal sterile fashion.  The arm was exsanguinated.  Tourniquet was used around the upper arm, inflated to 250 mmHg.  The wound was explored.  It was obvious that there was a deep laceration actually down into the joint capsule of the metacarpophalangeal joint.  The extensor tendon to the index finger was completely lacerated.  The wound was then thoroughly irrigated with about a L of saline solution.  Both the ends of the extensor tendon were grasped and approximated with  __________ 4-0 FiberWire suture.  The joint capsule was also repaired with figure-of-eight 4-0 FiberWire sutures.  Afterwards, the skin was approximated with vertical and simple interrupted 5-0 nylon sutures.  Sterile dressing and splint were placed. The patient tolerated the procedure well, was taken to the recovery room in stable condition.     Johnette Abraham, MD     HCC/MEDQ  D:  07/31/2014  T:  07/31/2014  Job:  [id number]

## 2014-07-31 NOTE — Anesthesia Procedure Notes (Addendum)
Procedure Name: Intubation Date/Time: 07/31/2014 8:37 AM Performed by: Wray Kearns A Pre-anesthesia Checklist: Patient identified, Timeout performed, Emergency Drugs available, Suction available and Patient being monitored Patient Re-evaluated:Patient Re-evaluated prior to inductionOxygen Delivery Method: Circle system utilized Preoxygenation: Pre-oxygenation with 100% oxygen Intubation Type: IV induction, Rapid sequence and Cricoid Pressure applied Laryngoscope Size: Mac and 4 Grade View: Grade I Tube type: Oral Tube size: 8.0 mm Number of attempts: 1 Airway Equipment and Method: Stylet Placement Confirmation: ETT inserted through vocal cords under direct vision,  breath sounds checked- equal and bilateral and positive ETCO2 Secured at: 23 cm Tube secured with: Tape Dental Injury: Teeth and Oropharynx as per pre-operative assessment

## 2014-07-31 NOTE — ED Provider Notes (Signed)
CSN: [id number]     Arrival date & time 07/31/14  0109 History   First MD Initiated Contact with Patient 07/31/14 [phone]     Chief Complaint  Patient presents with  . Laceration     (Consider location/radiation/quality/duration/timing/severity/associated sxs/prior Treatment) HPI Steven Barrera is a 19 y.o. male with history of asthma, presents to emergency department with right hand laceration. Patient states he punched a glass for [employer], which broke and cut his hand. Patient is right-handed. States he is having difficulty extending his index finger. He denies any other injuries. Tetanus is up-to-date. No treatment prior to coming in. Patient reports heavy bleeding from the wound.  Past Medical History  Diagnosis Date  . Asthma    History reviewed. No pertinent past surgical history. No family history on file. History  Substance Use Topics  . Smoking status: Never Smoker   . Smokeless tobacco: Not on file  . Alcohol Use: Yes    Review of Systems  Constitutional: Negative for fever and chills.  Respiratory: Negative for cough, chest tightness and shortness of breath.   Cardiovascular: Negative for chest pain, palpitations and leg swelling.  Genitourinary: Negative for dysuria, urgency, frequency and hematuria.  Musculoskeletal: Positive for joint swelling and arthralgias. Negative for myalgias, neck pain and neck stiffness.  Skin: Positive for wound. Negative for rash.  Allergic/Immunologic: Negative for immunocompromised state.  Neurological: Positive for weakness. Negative for dizziness, light-headedness, numbness and headaches.  All other systems reviewed and are negative.     Allergies  Shellfish allergy  Home Medications   Prior to Admission medications   Medication Sig Start Date End Date Taking? Authorizing Provider  brompheniramine-pseudoephedrine-DM 30-2-10 MG/5ML syrup Take 5 mLs by mouth 3 (three) times daily as needed. 06/03/14   Felicie Morn, NP   BP 111/52  mmHg  Pulse 70  Temp(Src) 98.5 F (36.9 C) (Oral)  Resp 16  Wt 329 lb 12.8 oz (149.596 kg)  SpO2 100% Physical Exam  Constitutional: He is oriented to person, place, and time. He appears well-developed and well-nourished. No distress.  Eyes: Conjunctivae are normal.  Neck: Neck supple.  Musculoskeletal:  4cm laceration to the dorsal right hand over 2nd metacarpal and extending towards the 1st webspace. 5/5 strength with finger flexion at mcp, PIP, DIP joints of index finger. Pt is able to extend finger against resistance at DIP and PIP joints. Unable to extend at [facility] joint. Cap refill <2sec. Sensation intact over distal index finger.   Neurological: He is alert and oriented to person, place, and time.  Skin: Skin is warm and dry.  Nursing note and vitals reviewed.   ED Course  Procedures (including critical care time) Labs Review Labs Reviewed - No data to display  Imaging Review Dg Hand Complete Right  07/31/2014   CLINICAL DATA:  Initial evaluation for acute trauma.  Laceration.  EXAM: RIGHT HAND - COMPLETE 3+ VIEW  COMPARISON:  None.  FINDINGS: Bandaging material overlies the right index finger. There is an underlying soft tissue defect, consistent with laceration. No radiopaque foreign body. No acute fracture or dislocation. Joint spaces well maintained without evidence of significant degenerative or erosive arthropathy. Osseous mineralization normal.  IMPRESSION: 1. Soft tissue laceration at the base of the right second digit. No radiopaque foreign body. 2. No acute fracture or dislocation.   Electronically Signed   By: Rise Mu M.D.   On: 07/31/2014 05:45     EKG Interpretation None      MDM  Final diagnoses:  None      patient with laceration to the right dorsal hand, unable to extend his index finger at MCP joint. He is otherwise neurovascularly intact. Tetanus up-to-date. Wound explored. No foreign body. X-rays negative. Most likely injury to the extensor  tendon. Will call hand surgery.   7:24 AM Spoke with Dr. Izora Ribas, will take to OR.  Pt updated about the plan.   Filed Vitals:   07/31/14 1000 07/31/14 1015 07/31/14 1030 07/31/14 1100  BP: 168/93 153/84 160/89   Pulse: 84 94 83   Temp:    98.7 F (37.1 C)  TempSrc:      Resp: 17 19 17    Weight:      SpO2: 97% 98% 97%        Jaynie Crumble, PA-C 07/31/14 1426  Loren Racer, MD 08/02/14 0401

## 2014-07-31 NOTE — ED Notes (Signed)
Kirichenko, PA at bedside.  

## 2014-07-31 NOTE — Anesthesia Postprocedure Evaluation (Signed)
Anesthesia Post Note  Patient: Steven Barrera  Procedure(s) Performed: Procedure(s) (LRB): WOUND EXPLORATION WITH EXTENSIVE TENDON REPAIR OF RIGHT INDEX FINGER (Right)  Anesthesia type: general  Patient location: PACU  Post pain: Pain level controlled  Post assessment: Patient's Cardiovascular Status Stable  Last Vitals:  Filed Vitals:   07/31/14 1000  BP: 168/93  Pulse: 84  Temp:   Resp: 17    Post vital signs: Reviewed and stable  Level of consciousness: sedated  Complications: No apparent anesthesia complications

## 2014-07-31 NOTE — Discharge Instructions (Signed)
Discharge Instructions:  Keep your dressing clean, dry and in place until instructed to remove by Dr. Ercel Normoyle.  If the dressing becomes dirty or wet call the office for instructions during business hours. Elevate the extremity to help with swelling, this will also help with any discomfort. Take your medication as prescribed. No lifting with the injured  extremity. If you feel that the dressing is too tight, you may loosen it, but keep it on; finger tips should be pink; if there is a concern, call the office. [phone] Ice may be used if the injury is a fracture, do not apply ice directly to the skin. Please call the office on the next business day after discharge to arrange a follow up appointment.  Call [phone] between the hours of 9am - 5pm M-Th or 9am - 1pm on Fri. For most hand injuries and/or conditions, you may return to work using the uninjured hand (one handed duty) within 24-72 hours.  A detailed note will be provided to you at your follow up appointment or may contact the office prior to your follow up.    

## 2014-07-31 NOTE — Anesthesia Preprocedure Evaluation (Addendum)
Anesthesia Evaluation  Patient identified by MRN, date of birth, ID band Patient awake    Reviewed: Allergy & Precautions, NPO status   Airway Mallampati: II  TM Distance: >3 FB Neck ROM: Full    Dental  (+) Teeth Intact, Dental Advisory Given   Pulmonary asthma ,    Pulmonary exam normal       Cardiovascular negative cardio ROS Normal cardiovascular exam    Neuro/Psych negative neurological ROS  negative psych ROS   GI/Hepatic negative GI ROS, Neg liver ROS,   Endo/Other    Renal/GU negative Renal ROS     Musculoskeletal   Abdominal   Peds  Hematology   Anesthesia Other Findings   Reproductive/Obstetrics                            Anesthesia Physical Anesthesia Plan  ASA: II and emergent  Anesthesia Plan: General   Post-op Pain Management:    Induction: Intravenous, Rapid sequence and Cricoid pressure planned  Airway Management Planned: Oral ETT  Additional Equipment:   Intra-op Plan:   Post-operative Plan: Extubation in OR  Informed Consent: I have reviewed the patients History and Physical, chart, labs and discussed the procedure including the risks, benefits and alternatives for the proposed anesthesia with the patient or authorized representative who has indicated his/her understanding and acceptance.   Dental advisory given  Plan Discussed with: CRNA, Surgeon and Anesthesiologist  Anesthesia Plan Comments:        Anesthesia Quick Evaluation

## 2014-07-31 NOTE — Transfer of Care (Signed)
Immediate Anesthesia Transfer of Care Note  Patient: Steven Barrera  Procedure(s) Performed: Procedure(s): WOUND EXPLORATION WITH EXTENSIVE TENDON REPAIR OF RIGHT INDEX FINGER (Right)  Patient Location: PACU  Anesthesia Type:General  Level of Consciousness: oriented, sedated, patient cooperative and responds to stimulation  Airway & Oxygen Therapy: Patient Spontanous Breathing and Patient connected to nasal cannula oxygen  Post-op Assessment: Report given to RN, Post -op Vital signs reviewed and stable, Patient moving all extremities and Patient moving all extremities X 4  Post vital signs: Reviewed and stable  Last Vitals:  Filed Vitals:   07/31/14 0806  BP: 135/52  Pulse: 73  Temp: 36.6 C  Resp: 16    Complications: No apparent anesthesia complications

## 2014-07-31 NOTE — H&P (Signed)
Reason for Consult:laceration RIF Referring Physician: ER  CC:I cut my hand  HPI:  Steven Barrera is an 19 y.o. right handed male who presents with  Laceration of RIF, pt punched a glass early this am      .   Pain is rated at   7 /10 and is described as sharp/dull.  Pain is constant.  Pain is made better by rest/immobilization, worse with motion.   Associated signs/symptoms: denies numbness Previous treatment:    Past Medical History  Diagnosis Date  . Asthma     History reviewed. No pertinent past surgical history.  History reviewed. No pertinent family history.  Social History:  reports that he has never smoked. He does not have any smokeless tobacco history on file. He reports that he drinks alcohol. He reports that he does not use illicit drugs.  Allergies:  Allergies  Allergen Reactions  . Shellfish Allergy Swelling    Facial swelling    Medications: I have reviewed the patient's current medications.  Results for orders placed or performed during the hospital encounter of 07/31/14 (from the past 48 hour(s))  I-Stat Chem 8, ED     Status: None   Collection Time: 07/31/14  8:07 AM  Result Value Ref Range   Sodium 143 135 - 145 mmol/L   Potassium 4.0 3.5 - 5.1 mmol/L   Chloride 106 101 - 111 mmol/L   BUN 14 6 - 20 mg/dL   Creatinine, Ser 1.61 0.61 - 1.24 mg/dL   Glucose, Bld 86 65 - 99 mg/dL   Calcium, Ion 0.96 1.12 - 1.23 mmol/L   TCO2 21 0 - 100 mmol/L   Hemoglobin 13.9 13.0 - 17.0 g/dL   HCT 04.5 40.9 - 81.1 %    Dg Hand Complete Right  07/31/2014   CLINICAL DATA:  Initial evaluation for acute trauma.  Laceration.  EXAM: RIGHT HAND - COMPLETE 3+ VIEW  COMPARISON:  None.  FINDINGS: Bandaging material overlies the right index finger. There is an underlying soft tissue defect, consistent with laceration. No radiopaque foreign body. No acute fracture or dislocation. Joint spaces well maintained without evidence of significant degenerative or erosive arthropathy.  Osseous mineralization normal.  IMPRESSION: 1. Soft tissue laceration at the base of the right second digit. No radiopaque foreign body. 2. No acute fracture or dislocation.   Electronically Signed   By: Rise Mu M.D.   On: 07/31/2014 05:45    Pertinent items are noted in HPI. Temp:  [97.8 F (36.6 C)-98.5 F (36.9 C)] 97.8 F (36.6 C) (07/24 0806) Pulse Rate:  [66-86] 73 (07/24 0806) Resp:  [16-20] 16 (07/24 0806) BP: (111-135)/(52-86) 135/52 mmHg (07/24 0806) SpO2:  [94 %-100 %] 100 % (07/24 0806) Weight:  [149.596 kg (329 lb 12.8 oz)] 149.596 kg (329 lb 12.8 oz) (07/24 0121) General appearance: alert and cooperative Resp: clear to auscultation bilaterally Cardio: regular rate and rhythm GI: soft, non-tender; bowel sounds normal; no masses,  no organomegaly Extremities: laceration of radial RIF with finger droop, inability to extend, N/V intact   Assessment: Laceration RIF, extensor tendon laceration Plan: Explore wound repair extensor tendon I have discussed this treatment plan in detail with patient , including the risks of the recommended treatment or surgery, the benefits and the alternatives.  The patient and/or caregiver understands that additional treatment may be necessary.  Kristen Bushway CHRISTOPHER 07/31/2014, 8:25 AM

## 2014-07-31 NOTE — ED Notes (Signed)
Pt st's he got mad and hit a glass.  Pt has lac to right hand.

## 2014-08-01 ENCOUNTER — Encounter (HOSPITAL_COMMUNITY): Payer: Self-pay | Admitting: General Surgery

## 2014-08-16 ENCOUNTER — Emergency Department (HOSPITAL_COMMUNITY)
Admission: EM | Admit: 2014-08-16 | Discharge: 2014-08-16 | Disposition: A | Discharge status: Home / Self Care | Payer: Medicaid Other | Attending: Emergency Medicine | Admitting: Emergency Medicine

## 2014-08-16 ENCOUNTER — Encounter (HOSPITAL_COMMUNITY): Payer: Self-pay | Admitting: Nurse Practitioner

## 2014-08-16 DIAGNOSIS — Z4802 Encounter for removal of sutures: Secondary | ICD-10-CM | POA: Diagnosis present

## 2014-08-16 DIAGNOSIS — J45909 Unspecified asthma, uncomplicated: Secondary | ICD-10-CM | POA: Diagnosis not present

## 2014-08-16 DIAGNOSIS — Z79899 Other long term (current) drug therapy: Secondary | ICD-10-CM | POA: Diagnosis not present

## 2014-08-16 NOTE — ED Provider Notes (Signed)
CSN: [id number]   Arrival date & time 08/16/14 1550  History  This chart was scribed for non-physician practitioner, Eyvonne Mechanic PA-C , working with No att. providers found by Bethel Born, ED Scribe. This patient was seen in room TR09C/TR09C and the patient's care was started at 4:19 PM.  Chief Complaint  Patient presents with  . Suture / Staple Removal    HPI The history is provided by the patient. No language interpreter was used.   Steven Barrera is a 19 y.o. male who presents to the Emergency Department for removal of sutures placed in the right hand on 07/31/14. He was told to have the stitches removes after 2 weeks but is unsure of his other discharge instructions. The stitches were placed by a hand surgeon after he cut the hand on shattered glass. At that time he was not given abx and denies current use. Pt removed the splint that was on the hand for the first time today.  Past Medical History  Diagnosis Date  . Asthma     Past Surgical History  Procedure Laterality Date  . Tendon repair Right 07/31/2014    Procedure: WOUND EXPLORATION WITH EXTENSIVE TENDON REPAIR OF RIGHT INDEX FINGER;  Surgeon: Knute Neu, MD;  Location: [facility] OR;  Service: Plastics;  Laterality: Right;    History reviewed. No pertinent family history.  Social History  Substance Use Topics  . Smoking status: Never Smoker   . Smokeless tobacco: None  . Alcohol Use: Yes     Review of Systems  All other systems reviewed and are negative.   Home Medications   Prior to Admission medications   Medication Sig Start Date End Date Taking? Authorizing Provider  cephALEXin (KEFLEX) 500 MG capsule Take 1 capsule (500 mg total) by mouth 4 (four) times daily. 07/31/14   Knute Neu, MD  traMADol (ULTRAM) 50 MG tablet Take 1 tablet (50 mg total) by mouth every 6 (six) hours as needed. 07/31/14   Knute Neu, MD    Allergies  Shellfish allergy  Triage Vitals: BP 125/62 mmHg  Pulse 94  Temp(Src) 98 F  (36.7 C) (Oral)  Resp 16  SpO2 98%  Physical Exam  Constitutional: He is oriented to person, place, and time. He appears well-developed and well-nourished.  HENT:  Head: Normocephalic.  Neck: Normal range of motion.  Pulmonary/Chest: Effort normal.  Abdominal: He exhibits no distension.  Musculoskeletal: Normal range of motion.  9 stitches in a L shape in the right hand Healing well, no discharge, no redness, no warmth  Neurological: He is alert and oriented to person, place, and time. He exhibits normal muscle tone.  Psychiatric: He has a normal mood and affect.  Nursing note and vitals reviewed.   ED Course  Procedures Suture Removal Performed by: Eyvonne Mechanic Consent: Verbal consent obtained. Risks and benefits: risks, benefits and alternatives were discussed Patient identity confirmed: provided demographic data Time out performed prior to procedure Laceration Location: Dorsal right hand Number of sutures removed 9 Patient tolerance: Patient tolerated the procedure well with no immediate complications.  Labs Review- Labs Reviewed - No data to display  Imaging Review No results found.  EKG Interpretation None  MDM   Final diagnoses:  Visit for suture removal     Labs  Imaging:   Consults   Therapeutics:   Assessment: Patient presents for suture removal after hand surgery. Patient had a lacerated tendon that was repaired. He was given discharge instructions but cannot recall them. He  is here status post 2 weeks with a volar splint that he has taken off. I was unable to find specific discharge instructions for orthopedic hand. The wounds look clean, sutures removed he was placed back in a volar splint, and instructed to follow-up tomorrow morning with orthopedic specialist for further evaluation and management. I did not do range of motion exercises of the hand in the event he was supposed to be splinted for prolonged period of time. Patient had distal sensation  and perfusion intact of the affected extremity.  Plan:  I personally performed the services described in this documentation, which was scribed in my presence. The recorded information has been reviewed and is accurate.     Eyvonne Mechanic, PA-C 08/17/14 1620  Glynn Octave, MD 08/18/14 [phone]

## 2014-08-16 NOTE — Discharge Instructions (Signed)
Incision Care An incision is when a surgeon cuts into your body tissues. After surgery, the incision needs to be cared for properly to prevent infection.  HOME CARE INSTRUCTIONS   Take all medicine as directed by your caregiver. Only take over-the-counter or prescription medicines for pain, discomfort, or fever as directed by your caregiver.  Do not remove your bandage (dressing) or get your incision wet until your surgeon gives you permission. In the event that your dressing becomes wet, dirty, or starts to smell, change the dressing and call your surgeon for instructions as soon as possible.  Take showers. Do not take tub baths, swim, or do anything that may soak the wound until it is healed.  Resume your normal diet and activities as directed or allowed.  Avoid lifting any weight until you are instructed otherwise.  Use anti-itch antihistamine medicine as directed by your caregiver. The wound may itch when it is healing. Do not pick or scratch at the wound.  Follow up with your caregiver for stitch (suture) or staple removal as directed.  Drink enough fluids to keep your urine clear or pale yellow. SEEK MEDICAL CARE IF:   You have redness, swelling, or increasing pain in the wound that is not controlled with medicine.  You have drainage, blood, or pus coming from the wound that lasts longer than 1 day.  You develop muscle aches, chills, or a general ill feeling.  You notice a bad smell coming from the wound or dressing.  Your wound edges separate after the sutures, staples, or skin adhesive strips have been removed.  You develop persistent nausea or vomiting. SEEK IMMEDIATE MEDICAL CARE IF:   You have a fever.  You develop a rash.  You develop dizzy episodes or faint while standing.  You have difficulty breathing.  You develop any reaction or side effects to medicine given. MAKE SURE YOU:   Understand these instructions.  Will watch your condition.  Will get help  right away if you are not doing well or get worse. Document Released: 07/13/2004 Document Revised: 03/18/2011 Document Reviewed: 02/17/2013 [facility] Patient Information 2015 [place], [place]. This information is not intended to replace advice given to you by your health care provider. Make sure you discuss any questions you have with your health care provider.  Please contact your surgeon first thing tomorrow morning for follow-up evaluation immediately. Please monitor for new or worsening signs or symptoms, return immediately if any present. Please remain in splint until surgeon and struck some otherwise.

## 2014-08-16 NOTE — Progress Notes (Signed)
Orthopedic Tech Progress Note Patient Details:  Steven Barrera 04/09/1995 [id number] Applied fiberglass volar splint to RUE.  Pulses, sensation, motion intact before and after splinting.  Capillary refill less than 2 seconds before and after splinting. Ortho Devices Type of Ortho Device: Volar splint Ortho Device/Splint Location: RUE Ortho Device/Splint Interventions: Application   [name] 08/16/2014, 5:02 PM

## 2014-08-16 NOTE — ED Notes (Signed)
Ortho tech paged  

## 2014-08-16 NOTE — ED Notes (Addendum)
He is here to sutures removed from R hand place on 7/24 here. States they are healing well and denies redness drainage or other signs of infection

## 2015-04-06 ENCOUNTER — Emergency Department (HOSPITAL_COMMUNITY): Payer: Self-pay

## 2015-04-06 ENCOUNTER — Observation Stay (HOSPITAL_COMMUNITY): Payer: Self-pay | Admitting: Anesthesiology

## 2015-04-06 ENCOUNTER — Observation Stay (HOSPITAL_COMMUNITY)
Admission: EM | Admit: 2015-04-06 | Discharge: 2015-04-07 | Disposition: A | Discharge status: Home / Self Care | Payer: Self-pay | Attending: General Surgery | Admitting: General Surgery

## 2015-04-06 ENCOUNTER — Encounter (HOSPITAL_COMMUNITY)
Admission: EM | Disposition: A | Discharge status: Home / Self Care | Payer: Self-pay | Source: Home / Self Care | Attending: Emergency Medicine

## 2015-04-06 ENCOUNTER — Encounter (HOSPITAL_COMMUNITY): Payer: Self-pay | Admitting: Emergency Medicine

## 2015-04-06 DIAGNOSIS — Z6838 Body mass index (BMI) 38.0-38.9, adult: Secondary | ICD-10-CM | POA: Insufficient documentation

## 2015-04-06 DIAGNOSIS — K353 Acute appendicitis with localized peritonitis, without perforation or gangrene: Secondary | ICD-10-CM

## 2015-04-06 DIAGNOSIS — E669 Obesity, unspecified: Secondary | ICD-10-CM | POA: Insufficient documentation

## 2015-04-06 DIAGNOSIS — K3580 Unspecified acute appendicitis: Secondary | ICD-10-CM | POA: Diagnosis present

## 2015-04-06 HISTORY — DX: Unspecified asthma, uncomplicated: J45.909

## 2015-04-06 HISTORY — PX: LAPAROSCOPIC APPENDECTOMY: SHX408

## 2015-04-06 HISTORY — PX: APPENDECTOMY: SHX54

## 2015-04-06 LAB — COMPREHENSIVE METABOLIC PANEL
ALT: 21 U/L (ref 17–63)
ANION GAP: 11 (ref 5–15)
AST: 25 U/L (ref 15–41)
Albumin: 3.7 g/dL (ref 3.5–5.0)
Alkaline Phosphatase: 48 U/L (ref 38–126)
BUN: 14 mg/dL (ref 6–20)
CALCIUM: 9.3 mg/dL (ref 8.9–10.3)
CO2: 19 mmol/L — AB (ref 22–32)
Chloride: 109 mmol/L (ref 101–111)
Creatinine, Ser: 1.08 mg/dL (ref 0.61–1.24)
GFR calc Af Amer: >60 mL/min (ref >60)
GFR calc non Af Amer: >60 mL/min (ref >60)
GLUCOSE: 114 mg/dL — AB (ref 65–99)
Potassium: 3.8 mmol/L (ref 3.5–5.1)
SODIUM: 139 mmol/L (ref 135–145)
Total Bilirubin: 0.7 mg/dL (ref 0.3–1.2)
Total Protein: 7 g/dL (ref 6.5–8.1)

## 2015-04-06 LAB — CBC
HCT: 38 % — ABNORMAL LOW (ref 39.0–52.0)
Hemoglobin: 12.9 g/dL — ABNORMAL LOW (ref 13.0–17.0)
MCH: 28.9 pg (ref 26.0–34.0)
MCHC: 33.9 g/dL (ref 30.0–36.0)
MCV: 85.2 fL (ref 78.0–100.0)
Platelets: 241 10*3/uL (ref 150–400)
RBC: 4.46 MIL/uL (ref 4.22–5.81)
RDW: 12.7 % (ref 11.5–15.5)
WBC: 8.2 10*3/uL (ref 4.0–10.5)

## 2015-04-06 LAB — URINALYSIS, ROUTINE W REFLEX MICROSCOPIC
BILIRUBIN URINE: NEGATIVE
GLUCOSE, UA: NEGATIVE mg/dL
KETONES UR: NEGATIVE mg/dL
Leukocytes, UA: NEGATIVE
Nitrite: NEGATIVE
PROTEIN: NEGATIVE mg/dL
Specific Gravity, Urine: >1.046 — ABNORMAL HIGH (ref 1.005–1.030)
pH: 5.5 (ref 5.0–8.0)

## 2015-04-06 LAB — URINE MICROSCOPIC-ADD ON

## 2015-04-06 LAB — LIPASE, BLOOD: Lipase: 17 U/L (ref 11–51)

## 2015-04-06 SURGERY — APPENDECTOMY, LAPAROSCOPIC
Anesthesia: General | Site: Abdomen

## 2015-04-06 MED ORDER — OXYCODONE-ACETAMINOPHEN 5-325 MG PO TABS
1.0000 | ORAL_TABLET | ORAL | Status: DC | PRN
  Administered 2015-04-07: 2 via ORAL
  Filled 2015-04-06: qty 2

## 2015-04-06 MED ORDER — MORPHINE SULFATE (PF) 2 MG/ML IV SOLN: 2.0000 mg | INTRAVENOUS | Status: DC | PRN

## 2015-04-06 MED ORDER — MIDAZOLAM HCL 2 MG/2ML IJ SOLN
INTRAMUSCULAR | Status: AC
  Filled 2015-04-06: qty 2

## 2015-04-06 MED ORDER — ROCURONIUM BROMIDE 100 MG/10ML IV SOLN
INTRAVENOUS | Status: DC | PRN
  Administered 2015-04-06: 30 mg via INTRAVENOUS

## 2015-04-06 MED ORDER — METRONIDAZOLE IN NACL 5-0.79 MG/ML-% IV SOLN
500.0000 mg | Freq: Once | INTRAVENOUS | Status: AC
  Administered 2015-04-06: 500 mg via INTRAVENOUS
  Filled 2015-04-06: qty 100

## 2015-04-06 MED ORDER — DIPHENHYDRAMINE HCL 50 MG/ML IJ SOLN: 25.0000 mg | Freq: Four times a day (QID) | INTRAMUSCULAR | Status: DC | PRN

## 2015-04-06 MED ORDER — IOPAMIDOL (ISOVUE-300) INJECTION 61%
INTRAVENOUS | Status: AC
  Administered 2015-04-06: 100 mL
  Filled 2015-04-06: qty 100

## 2015-04-06 MED ORDER — ONDANSETRON HCL 4 MG/2ML IJ SOLN
INTRAMUSCULAR | Status: AC
  Filled 2015-04-06: qty 2

## 2015-04-06 MED ORDER — LIDOCAINE HCL (CARDIAC) 20 MG/ML IV SOLN
INTRAVENOUS | Status: DC | PRN
  Administered 2015-04-06: 100 mg via INTRAVENOUS

## 2015-04-06 MED ORDER — ACETAMINOPHEN 325 MG PO TABS
ORAL_TABLET | ORAL | Status: AC
  Filled 2015-04-06: qty 2

## 2015-04-06 MED ORDER — PROPOFOL 10 MG/ML IV BOLUS
INTRAVENOUS | Status: AC
  Filled 2015-04-06: qty 20

## 2015-04-06 MED ORDER — FENTANYL CITRATE (PF) 100 MCG/2ML IJ SOLN
INTRAMUSCULAR | Status: DC | PRN
  Administered 2015-04-06: 50 ug via INTRAVENOUS
  Administered 2015-04-06: 250 ug via INTRAVENOUS

## 2015-04-06 MED ORDER — SODIUM CHLORIDE 0.9 % IV BOLUS (SEPSIS)
500.0000 mL | Freq: Once | INTRAVENOUS | Status: AC
  Administered 2015-04-06: 500 mL via INTRAVENOUS

## 2015-04-06 MED ORDER — MORPHINE SULFATE (PF) 4 MG/ML IV SOLN
4.0000 mg | Freq: Once | INTRAVENOUS | Status: AC
  Administered 2015-04-06: 4 mg via INTRAVENOUS
  Filled 2015-04-06: qty 1

## 2015-04-06 MED ORDER — KETOROLAC TROMETHAMINE 15 MG/ML IJ SOLN
15.0000 mg | Freq: Four times a day (QID) | INTRAMUSCULAR | Status: DC
  Administered 2015-04-06 – 2015-04-07 (×2): 15 mg via INTRAVENOUS
  Filled 2015-04-06 (×2): qty 1

## 2015-04-06 MED ORDER — SODIUM CHLORIDE 0.9 % IV BOLUS (SEPSIS)
1000.0000 mL | Freq: Once | INTRAVENOUS | Status: AC
  Administered 2015-04-06: 1000 mL via INTRAVENOUS

## 2015-04-06 MED ORDER — POTASSIUM CHLORIDE IN NACL 20-0.9 MEQ/L-% IV SOLN
INTRAVENOUS | Status: DC
Start: 2015-04-06 — End: 2015-04-06
  Filled 2015-04-06 (×3): qty 1000

## 2015-04-06 MED ORDER — ROCURONIUM BROMIDE 50 MG/5ML IV SOLN
INTRAVENOUS | Status: AC
  Filled 2015-04-06: qty 1

## 2015-04-06 MED ORDER — DEXAMETHASONE SODIUM PHOSPHATE 4 MG/ML IJ SOLN
INTRAMUSCULAR | Status: AC
  Filled 2015-04-06: qty 2

## 2015-04-06 MED ORDER — SUCCINYLCHOLINE 20MG/ML (10ML) SYRINGE FOR MEDFUSION PUMP - OPTIME
INTRAMUSCULAR | Status: DC | PRN
  Administered 2015-04-06: 120 mg via INTRAVENOUS

## 2015-04-06 MED ORDER — NEOSTIGMINE METHYLSULFATE 10 MG/10ML IV SOLN
INTRAVENOUS | Status: DC | PRN
  Administered 2015-04-06: 3 mg via INTRAVENOUS

## 2015-04-06 MED ORDER — BUPIVACAINE-EPINEPHRINE 0.25% -1:200000 IJ SOLN
INTRAMUSCULAR | Status: DC | PRN
  Administered 2015-04-06: 10 mL

## 2015-04-06 MED ORDER — FENTANYL CITRATE (PF) 250 MCG/5ML IJ SOLN
INTRAMUSCULAR | Status: AC
  Filled 2015-04-06: qty 5

## 2015-04-06 MED ORDER — HYDROMORPHONE HCL 1 MG/ML IJ SOLN: 0.2500 mg | INTRAMUSCULAR | Status: DC | PRN

## 2015-04-06 MED ORDER — DEXTROSE 5 % IV SOLN
2.0000 g | Freq: Once | INTRAVENOUS | Status: AC
  Administered 2015-04-06: 2 g via INTRAVENOUS
  Filled 2015-04-06: qty 2

## 2015-04-06 MED ORDER — ALBUTEROL SULFATE HFA 108 (90 BASE) MCG/ACT IN AERS
INHALATION_SPRAY | RESPIRATORY_TRACT | Status: AC
  Filled 2015-04-06: qty 6.7

## 2015-04-06 MED ORDER — BUPIVACAINE-EPINEPHRINE (PF) 0.25% -1:200000 IJ SOLN
INTRAMUSCULAR | Status: AC
  Filled 2015-04-06: qty 30

## 2015-04-06 MED ORDER — KETOROLAC TROMETHAMINE 30 MG/ML IJ SOLN
30.0000 mg | Freq: Once | INTRAMUSCULAR | Status: AC
  Administered 2015-04-06: 30 mg via INTRAVENOUS
  Filled 2015-04-06: qty 1

## 2015-04-06 MED ORDER — ONDANSETRON 4 MG PO TBDP: 4.0000 mg | ORAL_TABLET | Freq: Four times a day (QID) | ORAL | Status: DC | PRN

## 2015-04-06 MED ORDER — DIPHENHYDRAMINE HCL 25 MG PO CAPS: 25.0000 mg | ORAL_CAPSULE | Freq: Four times a day (QID) | ORAL | Status: DC | PRN

## 2015-04-06 MED ORDER — ONDANSETRON HCL 4 MG/2ML IJ SOLN
4.0000 mg | Freq: Four times a day (QID) | INTRAMUSCULAR | Status: DC | PRN
  Administered 2015-04-06: 4 mg via INTRAVENOUS

## 2015-04-06 MED ORDER — PROPOFOL 10 MG/ML IV BOLUS
INTRAVENOUS | Status: DC | PRN
  Administered 2015-04-06: 270 mg via INTRAVENOUS

## 2015-04-06 MED ORDER — MIDAZOLAM HCL 5 MG/5ML IJ SOLN
INTRAMUSCULAR | Status: DC | PRN
  Administered 2015-04-06: 2 mg via INTRAVENOUS

## 2015-04-06 MED ORDER — 0.9 % SODIUM CHLORIDE (POUR BTL) OPTIME
TOPICAL | Status: DC | PRN
  Administered 2015-04-06: 1000 mL

## 2015-04-06 MED ORDER — LIDOCAINE HCL (CARDIAC) 20 MG/ML IV SOLN
INTRAVENOUS | Status: AC
  Filled 2015-04-06: qty 5

## 2015-04-06 MED ORDER — ALBUTEROL SULFATE HFA 108 (90 BASE) MCG/ACT IN AERS
INHALATION_SPRAY | RESPIRATORY_TRACT | Status: DC | PRN
Start: 2015-04-06 — End: 2015-04-06
  Administered 2015-04-06: 3 via RESPIRATORY_TRACT

## 2015-04-06 MED ORDER — ONDANSETRON HCL 4 MG/2ML IJ SOLN
4.0000 mg | Freq: Once | INTRAMUSCULAR | Status: AC
  Administered 2015-04-06: 4 mg via INTRAVENOUS
  Filled 2015-04-06: qty 2

## 2015-04-06 MED ORDER — GLYCOPYRROLATE 0.2 MG/ML IJ SOLN
INTRAMUSCULAR | Status: DC | PRN
  Administered 2015-04-06: 0.4 mg via INTRAVENOUS

## 2015-04-06 MED ORDER — POTASSIUM CHLORIDE IN NACL 20-0.9 MEQ/L-% IV SOLN
INTRAVENOUS | Status: DC
  Administered 2015-04-06: 17:00:00 via INTRAVENOUS
  Filled 2015-04-06 (×2): qty 1000

## 2015-04-06 MED ORDER — SODIUM CHLORIDE 0.9 % IR SOLN
Status: DC | PRN
  Administered 2015-04-06: 1000 mL

## 2015-04-06 MED ORDER — LACTATED RINGERS IV SOLN
INTRAVENOUS | Status: DC
  Administered 2015-04-06 (×3): via INTRAVENOUS

## 2015-04-06 MED ORDER — ACETAMINOPHEN 325 MG PO TABS
650.0000 mg | ORAL_TABLET | Freq: Once | ORAL | Status: AC | PRN
  Administered 2015-04-06: 650 mg via ORAL

## 2015-04-06 SURGICAL SUPPLY — 52 items
ADH SKN CLS APL DERMABOND .7 (GAUZE/BANDAGES/DRESSINGS) ×1
APL SKNCLS STERI-STRIP NONHPOA (GAUZE/BANDAGES/DRESSINGS) ×1
APPLIER CLIP ROT 10 11.4 M/L (STAPLE)
APR CLP MED LRG 11.4X10 (STAPLE)
BAG SPEC RTRVL LRG 6X4 10 (ENDOMECHANICALS) ×1
BENZOIN TINCTURE PRP APPL 2/3 (GAUZE/BANDAGES/DRESSINGS) ×2 IMPLANT
BLADE SURG ROTATE 9660 (MISCELLANEOUS) ×2 IMPLANT
CANISTER SUCTION 2500CC (MISCELLANEOUS) ×3 IMPLANT
CHLORAPREP W/TINT 26ML (MISCELLANEOUS) ×3 IMPLANT
CLIP APPLIE ROT 10 11.4 M/L (STAPLE) IMPLANT
CLOSURE STERI-STRIP 1/2X4 (GAUZE/BANDAGES/DRESSINGS) ×1
CLOSURE WOUND 1/2 X4 (GAUZE/BANDAGES/DRESSINGS) ×1
CLSR STERI-STRIP ANTIMIC 1/2X4 (GAUZE/BANDAGES/DRESSINGS) ×1 IMPLANT
COVER SURGICAL LIGHT HANDLE (MISCELLANEOUS) ×3 IMPLANT
CUTTER FLEX LINEAR 45M (STAPLE) ×3 IMPLANT
DERMABOND ADVANCED (GAUZE/BANDAGES/DRESSINGS) ×2
DERMABOND ADVANCED .7 DNX12 (GAUZE/BANDAGES/DRESSINGS) ×1 IMPLANT
DRSG TEGADERM 2-3/8X2-3/4 SM (GAUZE/BANDAGES/DRESSINGS) ×9 IMPLANT
ELECT REM PT RETURN 9FT ADLT (ELECTROSURGICAL) ×3
ELECTRODE REM PT RTRN 9FT ADLT (ELECTROSURGICAL) ×1 IMPLANT
ENDOLOOP SUT PDS II  0 18 (SUTURE)
ENDOLOOP SUT PDS II 0 18 (SUTURE) IMPLANT
GLOVE BIOGEL PI IND STRL 7.0 (GLOVE) IMPLANT
GLOVE BIOGEL PI IND STRL 8 (GLOVE) ×1 IMPLANT
GLOVE BIOGEL PI INDICATOR 7.0 (GLOVE) ×2
GLOVE BIOGEL PI INDICATOR 8 (GLOVE) ×2
GLOVE ECLIPSE 7.5 STRL STRAW (GLOVE) ×3 IMPLANT
GLOVE SURG SS PI 6.5 STRL IVOR (GLOVE) ×2 IMPLANT
GOWN STRL REUS W/ TWL LRG LVL3 (GOWN DISPOSABLE) ×3 IMPLANT
GOWN STRL REUS W/TWL LRG LVL3 (GOWN DISPOSABLE) ×12
KIT BASIN OR (CUSTOM PROCEDURE TRAY) ×3 IMPLANT
KIT ROOM TURNOVER OR (KITS) ×3 IMPLANT
NS IRRIG 1000ML POUR BTL (IV SOLUTION) ×3 IMPLANT
PAD ARMBOARD 7.5X6 YLW CONV (MISCELLANEOUS) ×6 IMPLANT
POUCH SPECIMEN RETRIEVAL 10MM (ENDOMECHANICALS) ×3 IMPLANT
RELOAD 45 VASCULAR/THIN (ENDOMECHANICALS) IMPLANT
RELOAD STAPLE 45 2.5 WHT GRN (ENDOMECHANICALS) IMPLANT
RELOAD STAPLE 45 3.5 BLU ETS (ENDOMECHANICALS) IMPLANT
RELOAD STAPLE TA45 3.5 REG BLU (ENDOMECHANICALS) ×3 IMPLANT
SCALPEL HARMONIC ACE (MISCELLANEOUS) ×3 IMPLANT
SET IRRIG TUBING LAPAROSCOPIC (IRRIGATION / IRRIGATOR) ×3 IMPLANT
SLEEVE ENDOPATH XCEL 5M (ENDOMECHANICALS) ×3 IMPLANT
SPECIMEN JAR SMALL (MISCELLANEOUS) ×3 IMPLANT
STRIP CLOSURE SKIN 1/2X4 (GAUZE/BANDAGES/DRESSINGS) ×2 IMPLANT
SUT MNCRL AB 4-0 PS2 18 (SUTURE) ×3 IMPLANT
TOWEL OR 17X24 6PK STRL BLUE (TOWEL DISPOSABLE) ×1 IMPLANT
TOWEL OR 17X26 10 PK STRL BLUE (TOWEL DISPOSABLE) ×3 IMPLANT
TRAY FOLEY CATH 16FR SILVER (SET/KITS/TRAYS/PACK) ×3 IMPLANT
TRAY LAPAROSCOPIC MC (CUSTOM PROCEDURE TRAY) ×3 IMPLANT
TROCAR XCEL BLUNT TIP 100MML (ENDOMECHANICALS) ×3 IMPLANT
TROCAR XCEL NON-BLD 5MMX100MML (ENDOMECHANICALS) ×3 IMPLANT
TUBING INSUFFLATION (TUBING) ×3 IMPLANT

## 2015-04-06 NOTE — Anesthesia Preprocedure Evaluation (Addendum)
Anesthesia Evaluation  Patient identified by MRN, date of birth, ID band Patient awake    Reviewed: Allergy & Precautions, H&P , Patient's Chart, lab work & pertinent test results, reviewed documented beta blocker date and time   Airway Mallampati: II  TM Distance: >3 FB Neck ROM: full    Dental no notable dental hx.    Pulmonary asthma ,    Pulmonary exam normal breath sounds clear to auscultation       Cardiovascular  Rhythm:regular Rate:Normal     Neuro/Psych    GI/Hepatic   Endo/Other  Morbid obesity  Renal/GU      Musculoskeletal   Abdominal   Peds  Hematology   Anesthesia Other Findings   Reproductive/Obstetrics                             Anesthesia Physical Anesthesia Plan  ASA: III and emergent  Anesthesia Plan: General   Post-op Pain Management:    Induction: Intravenous, Rapid sequence and Cricoid pressure planned  Airway Management Planned: Oral ETT  Additional Equipment:   Intra-op Plan:   Post-operative Plan: Extubation in OR  Informed Consent: I have reviewed the patients History and Physical, chart, labs and discussed the procedure including the risks, benefits and alternatives for the proposed anesthesia with the patient or authorized representative who has indicated his/her understanding and acceptance.   Dental Advisory Given and Dental advisory given  Plan Discussed with: CRNA and Surgeon  Anesthesia Plan Comments: (  Discussed general anesthesia, including possible nausea, instrumentation of airway, sore throat,pulmonary aspiration, etc. I asked if the were any outstanding questions, or  concerns before we proceeded. )       Anesthesia Quick Evaluation

## 2015-04-06 NOTE — ED Notes (Addendum)
RLQ abd pain with N/V/D since Monday. Pt is diaphoretic, reports pain increases with movement and tender to palpation to RLQ.

## 2015-04-06 NOTE — Op Note (Signed)
OPERATIVE REPORT  DATE OF OPERATION: 04/06/2015  PATIENT:  Steven Barrera  20 y.o. male  PRE-OPERATIVE DIAGNOSIS:  acute appendicitis  POST-OPERATIVE DIAGNOSIS:  acute suppurative appendicitis without rupture  PROCEDURE:  Procedure(s): APPENDECTOMY LAPAROSCOPIC  SURGEON:  Surgeon(s): Jimmye NormanJames Nicholai Willette, MD  ASSISTANT: None  ANESTHESIA:   general  EBL: <10 ml  BLOOD ADMINISTERED: none  DRAINS: none   SPECIMEN:  Source of Specimen:  Appendix  COUNTS CORRECT:  YES  PROCEDURE DETAILS: The patient was taken to the operating room and placed on the table in the supine position. After an adequate general endotracheal anesthetic was administered, he was prepped and draped in the usual sterile manner exposing the entire abdomen.  A proper timeout was performed identifying the patient and procedure to be performed. A supraumbilical midline incision was made using a #15 blade and taken down to the midline fascia. We incised the fascia then bluntly dissected down into the peritoneal cavity using a Kelly clamp. A pursestring suture of 0 Vicryl was passed around the fascial opening which secured in placed a Hassan cannula used to insufflate carbon dioxide gas up to a maximal intra-abdominal pressure of 15 mmHg.  The patient was placed in Trendelenburg position. A right upper quadrant 5 mm cannula and a left low quadrant 5 mm cannula pass under direct vision. The left side was tilted down.  The acutely inflamed but nonperforated appendix was noted in the right lower quadrant attached to the cecum. We dissected out the mesial appendix at the base of the cecum and subsequently used a blue cartridge Endo GIA to come across the base of the cecum detaching it.  Once the appendix was detached we retreated from the supraumbilical site using an Endo Catch bag. We inspected the right lower quadrant finding there to be minimal to no bleeding and no evidence of any leakage. We aspirated all fluid and gas from  the perineal cavity then closed, tying off the pursestring suture at the supraumbilical site to close the fascia.  We injected 0.25% Marcaine with epinephrine and all sites. The skin at the supraumbilical was site was closed using a running subcuticular stitch of 4-0 Monocryl. Dermabond Steri-Strips and Tegaderms are used to complete our dressings. All needle counts, sponge counts, and instrument counts were correct.  PATIENT DISPOSITION:  PACU - hemodynamically stable.   Maximina Pirozzi 3/30/20172:53 PM

## 2015-04-06 NOTE — ED Provider Notes (Addendum)
CSN: [id number]     Arrival date & time 04/06/15  0801 History   First MD Initiated Contact with Patient 04/06/15 [id number]     Chief Complaint  Patient presents with  . Abdominal Pain     (Consider location/radiation/quality/duration/timing/severity/associated sxs/prior Treatment) HPI Comments: Patient states on Monday he had nausea vomiting and diarrhea like a stomach bug which persisted on Tuesday and Wednesday however the vomiting eventually stopped around Wednesday morning but he developed more significant right lower quadrant tenderness yesterday and then much worse this morning. Patient did not eat on Monday and Tuesday due to the nausea and vomiting but tried ED yesterday which made the pain much worse. He has had fever with these symptoms since Monday and is continuing to have a fever now.  Patient is a 20 y.o. male presenting with abdominal pain. The history is provided by the patient.  Abdominal Pain Pain location:  RLQ Pain quality: aching, gnawing and shooting   Pain radiates to:  Does not radiate Pain severity:  Moderate Onset quality:  Gradual Duration:  2 days Timing:  Constant Progression:  Worsening Chronicity:  New Context: eating and recent illness   Context: not sick contacts   Relieved by:  Nothing Worsened by:  Eating and movement Ineffective treatments:  None tried Associated symptoms: anorexia, diarrhea, fever, nausea and vomiting   Associated symptoms: no constipation, no cough and no dysuria   Risk factors: obesity   Risk factors: has not had multiple surgeries     Past Medical History  Diagnosis Date  . Asthma    Past Surgical History  Procedure Laterality Date  . Tendon repair Right 07/31/2014    Procedure: WOUND EXPLORATION WITH EXTENSIVE TENDON REPAIR OF RIGHT INDEX FINGER;  Surgeon: Knute NeuHarrill Coley, MD;  Location: [facility] OR;  Service: Plastics;  Laterality: Right;   No family history on file. Social History  Substance Use Topics  . Smoking status:  Never Smoker   . Smokeless tobacco: None  . Alcohol Use: Yes    Review of Systems  Constitutional: Positive for fever.  Respiratory: Negative for cough.   Gastrointestinal: Positive for nausea, vomiting, abdominal pain, diarrhea and anorexia. Negative for constipation.  Genitourinary: Negative for dysuria.  All other systems reviewed and are negative.     Allergies  Shellfish allergy  Home Medications   Prior to Admission medications   Not on File   BP 149/109 mmHg  Pulse 114  Temp(Src) 100.4 F (38 C) (Oral)  Resp 18  Ht 5\' 10"  (1.778 m)  Wt 265 lb (120.203 kg)  BMI 38.02 kg/m2  SpO2 98% Physical Exam  Constitutional: He is oriented to person, place, and time. He appears well-developed and well-nourished. No distress.  HENT:  Head: Normocephalic and atraumatic.  Mouth/Throat: Oropharynx is clear and moist. Mucous membranes are dry.  Eyes: Conjunctivae and EOM are normal. Pupils are equal, round, and reactive to light.  Neck: Normal range of motion. Neck supple.  Cardiovascular: Regular rhythm and intact distal pulses.  Tachycardia present.   No murmur heard. Pulmonary/Chest: Effort normal and breath sounds normal. No respiratory distress. He has no wheezes. He has no rales.  Abdominal: Soft. He exhibits no distension. There is tenderness in the right lower quadrant. There is guarding. There is no rebound and no CVA tenderness.  Musculoskeletal: Normal range of motion. He exhibits no edema or tenderness.  Neurological: He is alert and oriented to person, place, and time.  Skin: Skin is warm. No rash noted. He  is diaphoretic. No erythema.  Psychiatric: He has a normal mood and affect. His behavior is normal.  Nursing note and vitals reviewed.   ED Course  Procedures (including critical care time) Labs Review Labs Reviewed  COMPREHENSIVE METABOLIC PANEL - Abnormal; Notable for the following:    CO2 19 (*)    Glucose, Bld 114 (*)    All other components within  normal limits  CBC - Abnormal; Notable for the following:    Hemoglobin 12.9 (*)    HCT 38.0 (*)    All other components within normal limits  LIPASE, BLOOD  URINALYSIS, ROUTINE W REFLEX MICROSCOPIC (NOT AT [facility])    Imaging Review Ct Abdomen Pelvis W Contrast  04/06/2015  CLINICAL DATA:  Right lower quadrant pain starting Monday, worsening pain last night EXAM: CT ABDOMEN AND PELVIS WITH CONTRAST TECHNIQUE: Multidetector CT imaging of the abdomen and pelvis was performed using the standard protocol following bolus administration of intravenous contrast. Scan was performed 2 minutes 30 seconds after contrast administration as the patient vomited. CONTRAST:  ISOVUE-300 IOPAMIDOL (ISOVUE-300) INJECTION 61% COMPARISON:  None. FINDINGS: Lower chest:  Lung bases are unremarkable. Hepatobiliary: The visualized liver shows no biliary ductal dilatation. Pancreas: Unenhanced pancreas is unremarkable. Spleen: Unenhanced spleen is unremarkable. Adrenals/Urinary Tract: No adrenal gland mass is noted. Delayed renal images shows bilateral renal symmetrical excretion. No hydronephrosis or hydroureter. No focal renal mass. Stomach/Bowel: No small bowel obstruction. No ascites or free air. There is a low lying cecum. Mild thickening of cecal wall. Subtle mild stranding of pericecal fat. Axial image 75 there is abnormal thickening of the appendix. Mild thickening of appendiceal wall. Mild stranding of surrounding fat. Findings are highly suspicious for early appendicitis. The appendix measures 1.2 cm in diameter. Clinical correlation is necessary. There are enlarged lymph nodes in right lower quadrant mesentery the largest measures 1.3 cm probable reactive. A right retroperitoneal lymph node just a adjacent to IVC measures 1.3 cm probable reactive. Vascular/Lymphatic: No aortic aneurysm.  IVC appears patent. Reproductive: Prostate gland and seminal vesicles are unremarkable. Other: Bilateral nonspecific inguinal lymph  nodes are noted. There is no abdominal ascites or free air. No evidence of viscus perforation. Musculoskeletal: Sagittal images of the spine are unremarkable. IMPRESSION: 1. Axial image 75 there is abnormal thickening of the appendix. Mild thickening of appendiceal wall. Mild stranding of surrounding fat. Findings are highly suspicious for early appendicitis. The appendix measures 1.2 cm in diameter. Clinical correlation is necessary. 2. No hydronephrosis or hydroureter. Bilateral renal symmetrical excretion. 3. Mild enlarged lymph nodes in right retroperitoneum and right lower quadrant mesentery probable reactive. 4. No ascites or free air.  No definite evidence of perforation. 5. No small bowel obstruction. Mild thickening of cecal wall. Mild satellite colitis cannot be excluded. These results were called by telephone at the time of interpretation on 04/06/2015 at 10:57 am to Dr. Gwyneth Sprout , who verbally acknowledged these results. Electronically Signed   By: Natasha Mead M.D.   On: 04/06/2015 10:57   I have personally reviewed and evaluated these images and lab results as part of my medical decision-making.   EKG Interpretation None      MDM   Final diagnoses:  Acute appendicitis with localized peritonitis    Patient with admission for GI symptoms on Monday and Tuesday presenting today with right lower quadrant tenderness that started last night concerning for appendicitis. Patient has stopped vomiting but the pain has gotten worse in his right lower quadrant. He denies  any urinary symptoms and no prior surgeries. She has some guarding in the right lower quadrant but no rebound present. This could be mesenteric adenitis as well however given area of the pain will do a CT to ensure no appendicitis. Patient is febrile here, tachycardic and diaphoretic. Blood pressure is stable. Patient given IV fluids, pain and nausea medication. Low suspicion for cholecystitis, pancreatitis or  diverticulitis.  11:03 AM CT is consistent with early appendicitis. We'll discuss with general surgery. Patient started on antibiotics  Gwyneth Sprout, MD 04/06/15 1103  Gwyneth Sprout, MD 04/06/15 1119

## 2015-04-06 NOTE — Transfer of Care (Signed)
Immediate Anesthesia Transfer of Care Note  Patient: Steven Barrera  Procedure(s) Performed: Procedure(s): APPENDECTOMY LAPAROSCOPIC (N/A)  Patient Location: PACU  Anesthesia Type:General  Level of Consciousness: awake, alert , oriented and patient cooperative  Airway & Oxygen Therapy: Patient Spontanous Breathing and Patient connected to nasal cannula oxygen  Post-op Assessment: Report given to RN and Post -op Vital signs reviewed and stable  Post vital signs: Reviewed and stable  Last Vitals:  Filed Vitals:   04/06/15 0811 04/06/15 1300  BP: 149/109 113/38  Pulse: 114 71  Temp: 38 C   Resp: 18     Complications: No apparent anesthesia complications

## 2015-04-06 NOTE — H&P (Signed)
Steven Barrera is an 20 y.o. male.   Chief Complaint:  RLQ pain, nausea, vomiting and diarrhea since Monday. HPI: Pt presents with N/V/Diarrhea since Monday, increased pain with movement and palpation.  Fever up to 102 on Monday 04/03/15.  Ongoing nausea, vomiting and diarrhea, he says he used [employer] to come up with diagnosis of gastroenteritis.  Last night pain RLQ got much worse and he came to the ED  Work up shows low grade fever 100.4, BP is up.  Labs OK.  CT scan shows: abnormal thickening of the appendix. Mild thickening of appendiceal wall. Mild stranding of surrounding fat. Findings are highly suspicious for early appendicitis. The appendix measures 1.2 cm in diameter. Clinical correlation is necessary. There are enlarged lymph nodes in right lower quadrant mesentery the largest measures 1.3 cm probable reactive. A right retroperitoneal lymph node just a adjacent to IVC measures 1.3 cm probable reactive.  We are ask to see.    Past Medical History  Diagnosis Date  . Asthma     Past Surgical History  Procedure Laterality Date  . Tendon repair Right 07/31/2014    Procedure: WOUND EXPLORATION WITH EXTENSIVE TENDON REPAIR OF RIGHT INDEX FINGER;  Surgeon: Dayna Barker, MD;  Location: [facility];  Service: Plastics;  Laterality: Right;    No family history on file. Social History:  reports that he has never smoked. He does not have any smokeless tobacco history on file. He reports that he drinks alcohol. He reports that he does not use illicit drugs.  Allergies:  Allergies  Allergen Reactions  . Shellfish Allergy Swelling    Facial swelling    Prior to Admission medications   Not on File     Results for orders placed or performed during the hospital encounter of 04/06/15 (from the past 48 hour(s))  Lipase, blood     Status: None   Collection Time: 04/06/15  8:27 AM  Result Value Ref Range   Lipase 17 11 - 51 U/L  Comprehensive metabolic panel     Status: Abnormal   Collection  Time: 04/06/15  8:27 AM  Result Value Ref Range   Sodium 139 135 - 145 mmol/L   Potassium 3.8 3.5 - 5.1 mmol/L   Chloride 109 101 - 111 mmol/L   CO2 19 (L) 22 - 32 mmol/L   Glucose, Bld 114 (H) 65 - 99 mg/dL   BUN 14 6 - 20 mg/dL   Creatinine, Ser 1.08 0.61 - 1.24 mg/dL   Calcium 9.3 8.9 - 10.3 mg/dL   Total Protein 7.0 6.5 - 8.1 g/dL   Albumin 3.7 3.5 - 5.0 g/dL   AST 25 15 - 41 U/L   ALT 21 17 - 63 U/L   Alkaline Phosphatase 48 38 - 126 U/L   Total Bilirubin 0.7 0.3 - 1.2 mg/dL   GFR calc non Af Amer >60 >60 mL/min   GFR calc Af Amer >60 >60 mL/min    Comment: (NOTE) The eGFR has been calculated using the CKD EPI equation. This calculation has not been validated in all clinical situations. eGFR's persistently <60 mL/min signify possible Chronic Kidney Disease.    Anion gap 11 5 - 15  CBC     Status: Abnormal   Collection Time: 04/06/15  8:27 AM  Result Value Ref Range   WBC 8.2 4.0 - 10.5 K/uL   RBC 4.46 4.22 - 5.81 MIL/uL   Hemoglobin 12.9 (L) 13.0 - 17.0 g/dL   HCT 38.0 (L) 39.0 -  52.0 %   MCV 85.2 78.0 - 100.0 fL   MCH 28.9 26.0 - 34.0 pg   MCHC 33.9 30.0 - 36.0 g/dL   RDW 12.7 11.5 - 15.5 %   Platelets 241 150 - 400 K/uL   Ct Abdomen Pelvis W Contrast  04/06/2015  CLINICAL DATA:  Right lower quadrant pain starting Monday, worsening pain last night EXAM: CT ABDOMEN AND PELVIS WITH CONTRAST TECHNIQUE: Multidetector CT imaging of the abdomen and pelvis was performed using the standard protocol following bolus administration of intravenous contrast. Scan was performed 2 minutes 30 seconds after contrast administration as the patient vomited. CONTRAST:  125m ISOVUE-300 IOPAMIDOL (ISOVUE-300) INJECTION 61% COMPARISON:  None. FINDINGS: Lower chest:  Lung bases are unremarkable. Hepatobiliary: The visualized liver shows no biliary ductal dilatation. Pancreas: Unenhanced pancreas is unremarkable. Spleen: Unenhanced spleen is unremarkable. Adrenals/Urinary Tract: No adrenal gland  mass is noted. Delayed renal images shows bilateral renal symmetrical excretion. No hydronephrosis or hydroureter. No focal renal mass. Stomach/Bowel: No small bowel obstruction. No ascites or free air. There is a low lying cecum. Mild thickening of cecal wall. Subtle mild stranding of pericecal fat. Axial image 75 there is abnormal thickening of the appendix. Mild thickening of appendiceal wall. Mild stranding of surrounding fat. Findings are highly suspicious for early appendicitis. The appendix measures 1.2 cm in diameter. Clinical correlation is necessary. There are enlarged lymph nodes in right lower quadrant mesentery the largest measures 1.3 cm probable reactive. A right retroperitoneal lymph node just a adjacent to IVC measures 1.3 cm probable reactive. Vascular/Lymphatic: No aortic aneurysm.  IVC appears patent. Reproductive: Prostate gland and seminal vesicles are unremarkable. Other: Bilateral nonspecific inguinal lymph nodes are noted. There is no abdominal ascites or free air. No evidence of viscus perforation. Musculoskeletal: Sagittal images of the spine are unremarkable. IMPRESSION: 1. Axial image 75 there is abnormal thickening of the appendix. Mild thickening of appendiceal wall. Mild stranding of surrounding fat. Findings are highly suspicious for early appendicitis. The appendix measures 1.2 cm in diameter. Clinical correlation is necessary. 2. No hydronephrosis or hydroureter. Bilateral renal symmetrical excretion. 3. Mild enlarged lymph nodes in right retroperitoneum and right lower quadrant mesentery probable reactive. 4. No ascites or free air.  No definite evidence of perforation. 5. No small bowel obstruction. Mild thickening of cecal wall. Mild satellite colitis cannot be excluded. These results were called by telephone at the time of interpretation on 04/06/2015 at 10:57 am to Dr. WBlanchie Dessert, who verbally acknowledged these results. Electronically Signed   By: LLahoma CrockerM.D.   On:  04/06/2015 10:57    Review of Systems  Constitutional: Positive for fever (up to 102 on Monday 04/03/15) and chills. Negative for weight loss, malaise/fatigue and diaphoresis.  HENT: Negative.   Eyes: Negative.   Respiratory: Negative.   Cardiovascular: Negative.   Gastrointestinal: Positive for nausea, vomiting, abdominal pain (pain has always been in the RLQ) and diarrhea. Negative for heartburn, constipation, blood in stool and melena.  Genitourinary:       Decreased urine output since he got sick  Musculoskeletal: Negative.   Skin: Negative.   Neurological: Negative.  Negative for weakness.  Endo/Heme/Allergies: Negative.   Psychiatric/Behavioral: Negative.     Blood pressure 149/109, pulse 114, temperature 100.4 F (38 C), temperature source Oral, resp. rate 18, height 5' 10"  (1.778 m), weight 120.203 kg (265 lb), SpO2 98 %. Physical Exam  Constitutional: He is oriented to person, place, and time. He appears well-developed and well-nourished.  No distress.  HENT:  Head: Normocephalic and atraumatic.  Nose: Nose normal.  Eyes: Conjunctivae are normal. Right eye exhibits no discharge. Left eye exhibits no discharge. No scleral icterus.  Neck: Normal range of motion. Neck supple. No JVD present. No tracheal deviation present. No thyromegaly present.  Cardiovascular: Regular rhythm, normal heart sounds and intact distal pulses.   No murmur heard. Tachycardic,  BP 149/109 mmHg  Pulse 114  Temp(Src) 100.4 F (38 C) (Oral)  Resp 18  Ht 5' 10"  (1.778 m)  Wt 120.203 kg (265 lb)  BMI 38.02 kg/m2  SpO2 98%   Respiratory: Effort normal and breath sounds normal. No respiratory distress. He has no wheezes. He has no rales. He exhibits no tenderness.  GI: Soft. He exhibits no distension (RLQ) and no mass. There is tenderness. There is no rebound and no guarding.  Musculoskeletal: He exhibits no edema.  Lymphadenopathy:    He has no cervical adenopathy.  Neurological: He is alert and  oriented to person, place, and time. No cranial nerve deficit.  Skin: Skin is warm and dry. No rash noted. He is not diaphoretic. No erythema. No pallor.  Psychiatric: He has a normal mood and affect. His behavior is normal. Judgment and thought content normal.     Assessment/Plan Acute appendicitis BMI 38  Plan:  Antibiotics, hydrate and get ready for surgery later today.    Lallie Strahm, PA-C 04/06/2015, 11:15 AM

## 2015-04-07 ENCOUNTER — Encounter (HOSPITAL_COMMUNITY): Payer: Self-pay | Admitting: General Surgery

## 2015-04-07 ENCOUNTER — Encounter: Payer: Self-pay | Admitting: General Surgery

## 2015-04-07 MED ORDER — OXYCODONE-ACETAMINOPHEN 5-325 MG PO TABS: 1.0000 | ORAL_TABLET | ORAL | Status: DC | PRN

## 2015-04-07 MED ORDER — IBUPROFEN 200 MG PO TABS: ORAL_TABLET | ORAL | Status: DC

## 2015-04-07 MED ORDER — ACETAMINOPHEN 325 MG PO TABS: ORAL_TABLET | ORAL | Status: DC

## 2015-04-07 NOTE — Discharge Instructions (Signed)
[facility] ______[facility] °LAPAROSCOPIC SURGERY: POST OP INSTRUCTIONS °Always review your discharge instruction sheet given to you by the facility where your surgery was performed. °IF YOU HAVE DISABILITY OR FAMILY LEAVE FORMS, YOU MUST BRING THEM TO THE OFFICE FOR PROCESSING.   °DO NOT GIVE THEM TO YOUR DOCTOR. ° °1. A prescription for pain medication may be given to you upon discharge.  Take your pain medication as prescribed, if needed.  If narcotic pain medicine is not needed, then you may take acetaminophen (Tylenol) or ibuprofen (Advil) as needed. °2. Take your usually prescribed medications unless otherwise directed. °3. If you need a refill on your pain medication, please contact your pharmacy.  They will contact our office to request authorization. Prescriptions will not be filled after 5pm or on week-ends. °4. You should follow a light diet the first few days after arrival home, such as soup and crackers, etc.  Be sure to include lots of fluids daily. °5. Most patients will experience some swelling and bruising in the area of the incisions.  Ice packs will help.  Swelling and bruising can take several days to resolve.  °6. It is common to experience some constipation if taking pain medication after surgery.  Increasing fluid intake and taking a stool softener (such as Colace) will usually help or prevent this problem from occurring.  A mild laxative (Milk of Magnesia or Miralax) should be taken according to package instructions if there are no bowel movements after 48 hours. °7. Unless discharge instructions indicate otherwise, you may remove your bandages 24-48 hours after surgery, and you may shower at that time.  You may have steri-strips (small skin tapes) in place directly over the incision.  These strips should be left on the skin for 7-10 days.  If your surgeon used skin glue on the incision, you may shower in 24 hours.  The glue will flake off over the next 2-3 weeks.  Any sutures or  staples will be removed at the office during your follow-up visit. °8. ACTIVITIES:  You may resume regular (light) daily activities beginning the next day--such as daily self-care, walking, climbing stairs--gradually increasing activities as tolerated.  You may have sexual intercourse when it is comfortable.  Refrain from any heavy lifting or straining until approved by your doctor. °a. You may drive when you are no longer taking prescription pain medication, you can comfortably wear a seatbelt, and you can safely maneuver your car and apply brakes. °b. RETURN TO WORK:  __________________________________________________________ °9. You should see your doctor in the office for a follow-up appointment approximately 2-3 weeks after your surgery.  Make sure that you call for this appointment within a day or two after you arrive home to insure a convenient appointment time. °10. OTHER INSTRUCTIONS: __________________________________________________________________________________________________________________________ __________________________________________________________________________________________________________________________ °WHEN TO CALL YOUR DOCTOR: °1. Fever over 101.0 °2. Inability to urinate °3. Continued bleeding from incision. °4. Increased pain, redness, or drainage from the incision. °5. Increasing abdominal pain ° °The clinic staff is available to answer your questions during regular business hours.  Please don’t hesitate to call and ask to speak to one of the nurses for clinical concerns.  If you have a medical emergency, go to the nearest emergency room or call 911.  A surgeon from [facility] is always on call at the hospital. °[address], [address], [place], [place]  [address] ? [address] [address], , [place]   [address] °[phone] ? [phone] ? FAX [phone] °Web site:   [email or web address] ° °Laparoscopic Appendectomy, Adult, Care After °Refer to  this sheet in the next few weeks. These instructions provide you with information on caring for yourself after your procedure. Your caregiver may also give you more specific instructions. Your treatment has been planned according to current medical practices, but problems sometimes occur. Call your caregiver if you have any problems or questions after your procedure. °HOME CARE INSTRUCTIONS °· Do not drive while taking narcotic pain medicines. °· Use stool softener if you become constipated from your pain medicines. °· Change your bandages (dressings) as directed. °· Keep your wounds clean and dry. You may wash the wounds gently with soap and water. Gently pat the wounds dry with a clean towel. °· Do not take baths, swim, or use hot tubs for 10 days, or as instructed by your caregiver. °· Only take over-the-counter or prescription medicines for pain, discomfort, or fever as directed by your caregiver. °· You may continue your normal diet as directed. °· Do not lift more than 10 pounds (4.5 kg) or play contact sports for 3 weeks, or as directed. °· Slowly increase your activity after surgery. °· Take deep breaths to avoid getting a lung infection (pneumonia). °SEEK MEDICAL CARE IF: °· You have redness, swelling, or increasing pain in your wounds. °· You have pus coming from your wounds. °· You have drainage from a wound that lasts longer than 1 day. °· You notice a bad smell coming from the wounds or dressing. °· Your wound edges break open after stitches (sutures) have been removed. °· You notice increasing pain in the shoulders (shoulder strap areas) or near your shoulder blades. °· You develop dizzy episodes or fainting while standing. °· You develop shortness of breath. °· You develop persistent nausea or vomiting. °· You cannot control your bowel functions or lose your appetite. °· You develop diarrhea. °SEEK IMMEDIATE MEDICAL CARE IF:  °· You have a fever. °· You develop a rash. °· You have difficulty breathing  or sharp pains in your chest. °· You develop any reaction or side effects to medicines given. °MAKE SURE YOU: °· Understand these instructions. °· Will watch your condition. °· Will get help right away if you are not doing well or get worse. °  °This information is not intended to replace advice given to you by your health care provider. Make sure you discuss any questions you have with your health care provider. °  °Document Released: 12/24/2004 Document Revised: 05/10/2014 Document Reviewed: 06/13/2014 °Elsevier Interactive Patient Education ©2016 [employer] Inc[employer] ° ° ° °

## 2015-04-07 NOTE — Progress Notes (Signed)
1 Day Post-Op  Subjective: He is doing well post op and ready to go home.    Objective: Vital signs in last 24 hours: Temp:  [97.8 F (36.6 C)-98.8 F (37.1 C)] 98.2 F (36.8 C) (03/31 0610) Pulse Rate:  [71-97] 72 (03/31 0610) Resp:  [10-25] 15 (03/31 0610) BP: (105-158)/(38-78) 105/67 mmHg (03/31 0610) SpO2:  [90 %-98 %] 98 % (03/31 0610) Weight:  [128.5 kg (283 lb 4.7 oz)] 128.5 kg (283 lb 4.7 oz) (03/30 1707) Last BM Date: 04/06/15 Soft Diet Afebrile, VSS No labs Intake/Output from previous day: 03/30 0701 - 03/31 0700 In: 2695 [P.O.:240; I.V.:2455] Out: 580 [Urine:550; Blood:30] Intake/Output this shift: Total I/O In: 480 [P.O.:480] Out: -   General appearance: alert, cooperative and no distress GI: soft, non-tender; bowel sounds normal; no masses,  no organomegaly  Lab Results:   Recent Labs  04/06/15 0827  WBC 8.2  HGB 12.9*  HCT 38.0*  PLT 241    BMET  Recent Labs  04/06/15 0827  NA 139  K 3.8  CL 109  CO2 19*  GLUCOSE 114*  BUN 14  CREATININE 1.08  CALCIUM 9.3   PT/INR No results for input(s): LABPROT, INR in the last 72 hours.   Recent Labs Lab 04/06/15 0827  AST 25  ALT 21  ALKPHOS 48  BILITOT 0.7  PROT 7.0  ALBUMIN 3.7     Lipase     Component Value Date/Time   LIPASE 17 04/06/2015 0827     Studies/Results: Ct Abdomen Pelvis W Contrast  04/06/2015  CLINICAL DATA:  Right lower quadrant pain starting Monday, worsening pain last night EXAM: CT ABDOMEN AND PELVIS WITH CONTRAST TECHNIQUE: Multidetector CT imaging of the abdomen and pelvis was performed using the standard protocol following bolus administration of intravenous contrast. Scan was performed 2 minutes 30 seconds after contrast administration as the patient vomited. CONTRAST:  100mL ISOVUE-300 IOPAMIDOL (ISOVUE-300) INJECTION 61% COMPARISON:  None. FINDINGS: Lower chest:  Lung bases are unremarkable. Hepatobiliary: The visualized liver shows no biliary ductal dilatation.  Pancreas: Unenhanced pancreas is unremarkable. Spleen: Unenhanced spleen is unremarkable. Adrenals/Urinary Tract: No adrenal gland mass is noted. Delayed renal images shows bilateral renal symmetrical excretion. No hydronephrosis or hydroureter. No focal renal mass. Stomach/Bowel: No small bowel obstruction. No ascites or free air. There is a low lying cecum. Mild thickening of cecal wall. Subtle mild stranding of pericecal fat. Axial image 75 there is abnormal thickening of the appendix. Mild thickening of appendiceal wall. Mild stranding of surrounding fat. Findings are highly suspicious for early appendicitis. The appendix measures 1.2 cm in diameter. Clinical correlation is necessary. There are enlarged lymph nodes in right lower quadrant mesentery the largest measures 1.3 cm probable reactive. A right retroperitoneal lymph node just a adjacent to IVC measures 1.3 cm probable reactive. Vascular/Lymphatic: No aortic aneurysm.  IVC appears patent. Reproductive: Prostate gland and seminal vesicles are unremarkable. Other: Bilateral nonspecific inguinal lymph nodes are noted. There is no abdominal ascites or free air. No evidence of viscus perforation. Musculoskeletal: Sagittal images of the spine are unremarkable. IMPRESSION: 1. Axial image 75 there is abnormal thickening of the appendix. Mild thickening of appendiceal wall. Mild stranding of surrounding fat. Findings are highly suspicious for early appendicitis. The appendix measures 1.2 cm in diameter. Clinical correlation is necessary. 2. No hydronephrosis or hydroureter. Bilateral renal symmetrical excretion. 3. Mild enlarged lymph nodes in right retroperitoneum and right lower quadrant mesentery probable reactive. 4. No ascites or free air.  No  definite evidence of perforation. 5. No small bowel obstruction. Mild thickening of cecal wall. Mild satellite colitis cannot be excluded. These results were called by telephone at the time of interpretation on  04/06/2015 at 10:57 am to Dr. Gwyneth Sprout , who verbally acknowledged these results. Electronically Signed   By: Natasha Mead M.D.   On: 04/06/2015 10:57    Medications: . ketorolac  15 mg Intravenous 4 times per day    Assessment/Plan acute suppurative appendicitis without rupture S/p laparoscopic appendectomy 04/06/15 BMI 38    Plan:  Home today, we talked about work and weight loss after he recovers from surgery.      Sherrie George 04/07/2015 [phone]

## 2015-04-07 NOTE — Progress Notes (Signed)
[name] to be D/C'd  per MD order. Discussed with the patient and all questions fully answered.  VSS, Skin clean, dry and intact without evidence of skin break down, no evidence of skin tears noted.  IV catheter discontinued intact. Site without signs and symptoms of complications. Dressing and pressure applied.  An After Visit Summary was printed and given to the patient. Patient received prescription.  D/c education completed with patient/family including follow up instructions, medication list, d/c activities limitations if indicated, with other d/c instructions as indicated by MD - patient able to verbalize understanding, all questions fully answered.   Patient instructed to return to ED, call 911, or call MD for any changes in condition.   Patient to be escorted via WC, and D/C home via private auto.

## 2015-04-09 NOTE — Discharge Summary (Signed)
Physician Discharge Summary  Patient ID: Steven Barrera MRN: [id number] DOB/AGE: 28-Apr-1995 20 y.o.  Admit date: 04/06/2015 Discharge date: 04/07/2015  Admission Diagnoses:   Acute appendicitis  Discharge Diagnoses:   Acute suppurative appendicitis without rupture BMI 38  Active Problems:   Acute appendicitis   PROCEDURES: laparoscopic appendectomy 04/06/15  Hospital Course:  Chief Complaint: RLQ pain, nausea, vomiting and diarrhea since Monday. HPI: Pt presents with N/V/Diarrhea since Monday, increased pain with movement and palpation. Fever up to 102 on Monday 04/03/15. Ongoing nausea, vomiting and diarrhea, he says he used [employer] to come up with diagnosis of gastroenteritis. Last night pain RLQ got much worse and he came to the ED Work up shows low grade fever 100.4, BP is up. Labs OK. CT scan shows: abnormal thickening of the appendix. Mild thickening of appendiceal wall. Mild stranding of surrounding fat. Findings are highly suspicious for early appendicitis. The appendix measures 1.2 cm in diameter. Clinical correlation is necessary. There are enlarged lymph nodes in right lower quadrant mesentery the largest measures 1.3 cm probable reactive. A right retroperitoneal lymph node just a adjacent to IVC measures 1.3 cm probable reactive. He was seen in the ED and taken to the OR later that day.  He tolerated the procedure well and was discharged home the following AM.  We talked about long term issues with his weight.    Condition on dc:  Improved    Disposition: 01-Home or Self Care     Medication List    TAKE these medications        acetaminophen 325 MG tablet  Commonly known as:  TYLENOL  You cannot take more than 4000 mg of Tylenol per day, this is in your prescription pain medicine, so you have to count it.     ibuprofen 200 MG tablet  Commonly known as:  MOTRIN IB  You can take 2-3 tablets every 6 hours as needed for pain.  You can buy this at any store.     oxyCODONE-acetaminophen 5-325 MG tablet  Commonly known as:  PERCOCET/ROXICET  Take 1-2 tablets by mouth every 4 (four) hours as needed for moderate pain.           Follow-up Information    Follow up with [facility]   Why:  Get them to help you with your weight after you get better.   Contact information:   [address] [place] [address] [phone]       Follow up with [facility] On 04/25/2015.   Specialty:  General Surgery   Why:  Your appointment is at 2:45, be at the office 30 minutes early for check in.   Contact information:   [address] [place] [address] [phone]       Signed: Sherrie GeorgeJENNINGS,Duana Benedict 04/09/2015, 11:34 AM

## 2015-04-19 NOTE — Anesthesia Postprocedure Evaluation (Signed)
Anesthesia Post Note  Patient: Steven Barrera  Procedure(s) Performed: Procedure(s) (LRB): APPENDECTOMY LAPAROSCOPIC (N/A)  Patient location during evaluation: PACU Anesthesia Type: General Level of consciousness: sedated Pain management: satisfactory to patient Vital Signs Assessment: post-procedure vital signs reviewed and stable Respiratory status: spontaneous breathing Cardiovascular status: stable Anesthetic complications: no    Last Vitals:  Filed Vitals:   04/07/15 0240 04/07/15 0610  BP: 112/75 105/67  Pulse: 80 72  Temp: 36.8 C 36.8 C  Resp: 18 15    Last Pain:  Filed Vitals:   04/07/15 1245  PainSc: 6                  Yaman Grauberger EDWARD

## 2015-05-22 ENCOUNTER — Encounter (HOSPITAL_COMMUNITY): Payer: Self-pay | Admitting: *Deleted

## 2015-05-22 ENCOUNTER — Ambulatory Visit (HOSPITAL_COMMUNITY)
Admission: EM | Admit: 2015-05-22 | Discharge: 2015-05-22 | Disposition: A | Discharge status: Home / Self Care | Payer: Medicaid Other | Attending: Emergency Medicine | Admitting: Emergency Medicine

## 2015-05-22 DIAGNOSIS — H6501 Acute serous otitis media, right ear: Secondary | ICD-10-CM

## 2015-05-22 DIAGNOSIS — J302 Other seasonal allergic rhinitis: Secondary | ICD-10-CM

## 2015-05-22 DIAGNOSIS — T700XXA Otitic barotrauma, initial encounter: Secondary | ICD-10-CM

## 2015-05-22 MED ORDER — AMOXICILLIN 500 MG PO CAPS: 1000.0000 mg | ORAL_CAPSULE | Freq: Two times a day (BID) | ORAL | Status: DC

## 2015-05-22 MED ORDER — IPRATROPIUM BROMIDE 0.06 % NA SOLN: 2.0000 | Freq: Four times a day (QID) | NASAL | Status: DC

## 2015-05-22 NOTE — ED Provider Notes (Signed)
CSN: [id number]     Arrival date & time 05/22/15  1312 History   First MD Initiated Contact with Patient 05/22/15 1410     Chief Complaint  Patient presents with  . Otalgia  . Sore Throat   (Consider location/radiation/quality/duration/timing/severity/associated sxs/prior Treatment) HPI Comments: 20 year old male complaining of a three-week history of cough, PND and decreased hearing in the left ear. Approximate 5 days ago he had problems with pain and decrease in the right ear. Also complains of nasal stuffiness and discharge, cough and sneezing. He has tried NyQuil and other unnamed OTC medications. With little relief.   Past Medical History  Diagnosis Date  . Childhood asthma    Past Surgical History  Procedure Laterality Date  . Tendon repair Right 07/31/2014    Procedure: WOUND EXPLORATION WITH EXTENSIVE TENDON REPAIR OF RIGHT INDEX FINGER;  Surgeon: Knute Neu, MD;  Location: [facility] OR;  Service: Plastics;  Laterality: Right;  . Appendectomy  04/06/2015  . Laparoscopic appendectomy N/A 04/06/2015    Procedure: APPENDECTOMY LAPAROSCOPIC;  Surgeon: Jimmye Norman, MD;  Location: [facility] OR;  Service: General;  Laterality: N/A;   History reviewed. No pertinent family history. Social History  Substance Use Topics  . Smoking status: Current Some Day Smoker -- 3 years    Types: Cigars  . Smokeless tobacco: Never Used  . Alcohol Use: Yes     Comment: 04/06/2015 "I'll drink on holidays/birthdays"    Review of Systems  Constitutional: Negative for fever, diaphoresis, activity change and fatigue.  HENT: Positive for congestion, ear pain, postnasal drip and rhinorrhea. Negative for facial swelling, sore throat and trouble swallowing.   Eyes: Negative for pain, discharge and redness.  Respiratory: Positive for cough. Negative for chest tightness and shortness of breath.   Cardiovascular: Negative.   Gastrointestinal: Negative.   Musculoskeletal: Negative.  Negative for neck pain and neck  stiffness.  Neurological: Negative.     Allergies  Shellfish allergy  Home Medications   Prior to Admission medications   Medication Sig Start Date End Date Taking? Authorizing Provider  amoxicillin (AMOXIL) 500 MG capsule Take 2 capsules (1,000 mg total) by mouth 2 (two) times daily. 05/22/15   Hayden Rasmussen, NP  ipratropium (ATROVENT) 0.06 % nasal spray Place 2 sprays into both nostrils 4 (four) times daily. 05/22/15   Hayden Rasmussen, NP   Meds Ordered and Administered this Visit  Medications - No data to display  Pulse 88  Temp(Src) 98.1 F (36.7 C) (Oral)  Resp 17  SpO2 96% No data found.   Physical Exam  Constitutional: He is oriented to person, place, and time. He appears well-developed and well-nourished. No distress.  HENT:  Mouth/Throat: No oropharyngeal exudate.  Left TM with retraction and injection. No effusion or other discoloration. No drainage. Right TM with greater retraction, deeper erythema and effusion to the lower half of the year.  Oropharynx with erythema and clear PND.  Eyes: Conjunctivae and EOM are normal.  Neck: Normal range of motion. Neck supple.  Cardiovascular: Normal rate and regular rhythm.   Pulmonary/Chest: Effort normal and breath sounds normal. No respiratory distress.  Musculoskeletal: Normal range of motion. He exhibits no edema.  Lymphadenopathy:    He has no cervical adenopathy.  Neurological: He is alert and oriented to person, place, and time.  Skin: Skin is warm and dry. No rash noted.  Psychiatric: He has a normal mood and affect.  Nursing note and vitals reviewed.   ED Course  Procedures (including critical care time)  Labs Review Labs Reviewed - No data to display  Imaging Review No results found.   Visual Acuity Review  Right Eye Distance:   Left Eye Distance:   Bilateral Distance:    Right Eye Near:   Left Eye Near:    Bilateral Near:         MDM   1. Other seasonal allergic rhinitis   2. Barotitis media,  initial encounter   3. Right acute serous otitis media, recurrence not specified    Meds ordered this encounter  Medications  . amoxicillin (AMOXIL) 500 MG capsule    Sig: Take 2 capsules (1,000 mg total) by mouth 2 (two) times daily.    Dispense:  32 capsule    Refill:  0    Order Specific Question:  Supervising Provider    Answer:  Linna HoffKINDL, JAMES D [phone][[id number]]  . ipratropium (ATROVENT) 0.06 % nasal spray    Sig: Place 2 sprays into both nostrils 4 (four) times daily.    Dispense:  15 mL    Refill:  12    Order Specific Question:  Supervising Provider    Answer:  Linna HoffKINDL, JAMES D [phone][[id number]]   Allergic Rhinitis For nasal and head congestion may take Sudafed PE 10 mg every 4 hours as needed. Saline nasal spray used frequently. For drainage may use Allegra, Claritin or Zyrtec. If you need stronger medicine to stop drainage may take Chlor-Trimeton 2-4 mg every 4 hours. This may cause drowsiness. Ibuprofen 600 mg every 6 hours as needed for pain, discomfort or fever. Drink plenty of fluids and stay well-hydrated. Rhinocort or Flonase nasal spray for about 1 month    Hayden Rasmussenavid Chasya Zenz, NP 05/22/15 1430

## 2015-05-22 NOTE — Discharge Instructions (Signed)
Allergic Rhinitis For nasal and head congestion may take Sudafed PE 10 mg every 4 hours as needed. Saline nasal spray used frequently. For drainage may use Allegra, Claritin or Zyrtec. If you need stronger medicine to stop drainage may take Chlor-Trimeton 2-4 mg every 4 hours. This may cause drowsiness. Ibuprofen 600 mg every 6 hours as needed for pain, discomfort or fever. Drink plenty of fluids and stay well-hydrated. Rhinocort or Flonase nasal spray for about 1 month  Allergic rhinitis is when the mucous membranes in the nose respond to allergens. Allergens are particles in the air that cause your body to have an allergic reaction. This causes you to release allergic antibodies. Through a chain of events, these eventually cause you to release histamine into the blood stream. Although meant to protect the body, it is this release of histamine that causes your discomfort, such as frequent sneezing, congestion, and an itchy, runny nose.  CAUSES Seasonal allergic rhinitis (hay fever) is caused by pollen allergens that may come from grasses, trees, and weeds. Year-round allergic rhinitis (perennial allergic rhinitis) is caused by allergens such as house dust mites, pet dander, and mold spores. SYMPTOMS  Nasal stuffiness (congestion).  Itchy, runny nose with sneezing and tearing of the eyes. DIAGNOSIS Your health care provider can help you determine the allergen or allergens that trigger your symptoms. If you and your health care provider are unable to determine the allergen, skin or blood testing may be used. Your health care provider will diagnose your condition after taking your health history and performing a physical exam. Your health care provider may assess you for other related conditions, such as asthma, pink eye, or an ear infection. TREATMENT Allergic rhinitis does not have a cure, but it can be controlled by:  Medicines that block allergy symptoms. These may include allergy shots, nasal  sprays, and oral antihistamines.  Avoiding the allergen. Hay fever may often be treated with antihistamines in pill or nasal spray forms. Antihistamines block the effects of histamine. There are over-the-counter medicines that may help with nasal congestion and swelling around the eyes. Check with your health care provider before taking or giving this medicine. If avoiding the allergen or the medicine prescribed do not work, there are many new medicines your health care provider can prescribe. Stronger medicine may be used if initial measures are ineffective. Desensitizing injections can be used if medicine and avoidance does not work. Desensitization is when a patient is given ongoing shots until the body becomes less sensitive to the allergen. Make sure you follow up with your health care provider if problems continue. HOME CARE INSTRUCTIONS It is not possible to completely avoid allergens, but you can reduce your symptoms by taking steps to limit your exposure to them. It helps to know exactly what you are allergic to so that you can avoid your specific triggers. SEEK MEDICAL CARE IF:  You have a fever.  You develop a cough that does not stop easily (persistent).  You have shortness of breath.  You start wheezing.  Symptoms interfere with normal daily activities.   This information is not intended to replace advice given to you by your health care provider. Make sure you discuss any questions you have with your health care provider.   Document Released: 09/18/2000 Document Revised: 01/14/2014 Document Reviewed: 08/31/2012 Elsevier Interactive Patient Education 2016 [employer].  [employer] Barotitis media is inflammation of your middle ear. This occurs when the auditory tube (eustachian tube) leading from the back of  your nose (nasopharynx) to your eardrum is blocked. This blockage may result from a cold, environmental allergies, or an upper respiratory infection. Unresolved  barotitis media may lead to damage or hearing loss (barotrauma), which may become permanent. HOME CARE INSTRUCTIONS   Use medicines as recommended by your health care provider. Over-the-counter medicines will help unblock the canal and can help during times of air travel.  Do not put anything into your ears to clean or unplug them. Eardrops will not be helpful.  Do not swim, dive, or fly until your health care provider says it is all right to do so. If these activities are necessary, chewing gum with frequent, forceful swallowing may help. It is also helpful to hold your nose and gently blow to pop your ears for equalizing pressure changes. This forces air into the eustachian tube.  Only take over-the-counter or prescription medicines for pain, discomfort, or fever as directed by your health care provider.  A decongestant may be helpful in decongesting the middle ear and make pressure equalization easier. SEEK MEDICAL CARE IF:  You experience a serious form of dizziness in which you feel as if the room is spinning and you feel nauseated (vertigo).  Your symptoms only involve one ear. SEEK IMMEDIATE MEDICAL CARE IF:   You develop a severe headache, dizziness, or severe ear pain.  You have bloody or pus-like drainage from your ears.  You develop a fever.  Your problems do not improve or become worse. MAKE SURE YOU:   Understand these instructions.  Will watch your condition.  Will get help right away if you are not doing well or get worse.   This information is not intended to replace advice given to you by your health care provider. Make sure you discuss any questions you have with your health care provider.   Document Released: 12/22/1999 Document Revised: 10/14/2012 Document Reviewed: 07/21/2012 Elsevier Interactive Patient Education 2016 Elsevier Inc.  Hay Fever Hay fever is an allergic reaction to particles in the air. It cannot be passed from person to person. It cannot  be cured, but it can be controlled. CAUSES  Hay fever is caused by something that triggers an allergic reaction (allergens). The following are examples of allergens:  Ragweed.  Feathers.  Animal dander.  Grass and tree pollens.  Cigarette smoke.  House dust.  Pollution. SYMPTOMS   Sneezing.  Runny or stuffy nose.  Tearing eyes.  Itchy eyes, nose, mouth, throat, skin, or other area.  Sore throat.  Headache.  Decreased sense of smell or taste. DIAGNOSIS Your caregiver will perform a physical exam and ask questions about the symptoms you are having.Allergy testing may be done to determine exactly what triggers your hay fever.  TREATMENT   Over-the-counter medicines may help symptoms. These include:  Antihistamines.  Decongestants. These may help with nasal congestion.  Your caregiver may prescribe medicines if over-the-counter medicines do not work.  Some people benefit from allergy shots when other medicines are not helpful. HOME CARE INSTRUCTIONS   Avoid the allergen that is causing your symptoms, if possible.  Take all medicine as told by your caregiver. SEEK MEDICAL CARE IF:   You have severe allergy symptoms and your current medicines are not helping.  Your treatment was working at one time, but you are now experiencing symptoms.  You have sinus congestion and pressure.  You develop a fever or headache.  You have thick nasal discharge.  You have asthma and have a worsening cough and wheezing. SEEK  IMMEDIATE MEDICAL CARE IF:   You have swelling of your tongue or lips.  You have trouble breathing.  You feel lightheaded or like you are going to faint.  You have cold sweats.  You have a fever.   This information is not intended to replace advice given to you by your health care provider. Make sure you discuss any questions you have with your health care provider.   Document Released: 12/24/2004 Document Revised: 03/18/2011 Document  Reviewed: 07/06/2014 Elsevier Interactive Patient Education 2016 [employer].  Otitis Media, Adult Otitis media is redness, soreness, and puffiness (swelling) in the space just behind your eardrum (middle ear). It may be caused by allergies or infection. It often happens along with a cold. HOME CARE  Take your medicine as told. Finish it even if you start to feel better.  Only take over-the-counter or prescription medicines for pain, discomfort, or fever as told by your doctor.  Follow up with your doctor as told. GET HELP IF:  You have otitis media only in one ear, or bleeding from your nose, or both.  You notice a lump on your neck.  You are not getting better in 3-5 days.  You feel worse instead of better. GET HELP RIGHT AWAY IF:   You have pain that is not helped with medicine.  You have puffiness, redness, or pain around your ear.  You get a stiff neck.  You cannot move part of your face (paralysis).  You notice that the bone behind your ear hurts when you touch it. MAKE SURE YOU:   Understand these instructions.  Will watch your condition.  Will get help right away if you are not doing well or get worse.   This information is not intended to replace advice given to you by your health care provider. Make sure you discuss any questions you have with your health care provider.   Document Released: 06/12/2007 Document Revised: 01/14/2014 Document Reviewed: 07/21/2012 Elsevier Interactive Patient Education [employer]

## 2015-05-22 NOTE — ED Notes (Signed)
Patient reports 3 weeks history of cough, cold like symptoms, and now bilateral ear pain. Denies fevers.

## 2016-11-14 IMAGING — DX DG HAND COMPLETE 3+V*R*
3 series · 3 of 3 positions shown · non-contrast
Comparison: None.

CLINICAL DATA: Initial evaluation for acute trauma.  Laceration.

EXAM:
RIGHT HAND - COMPLETE 3+ VIEW

[hand pa]
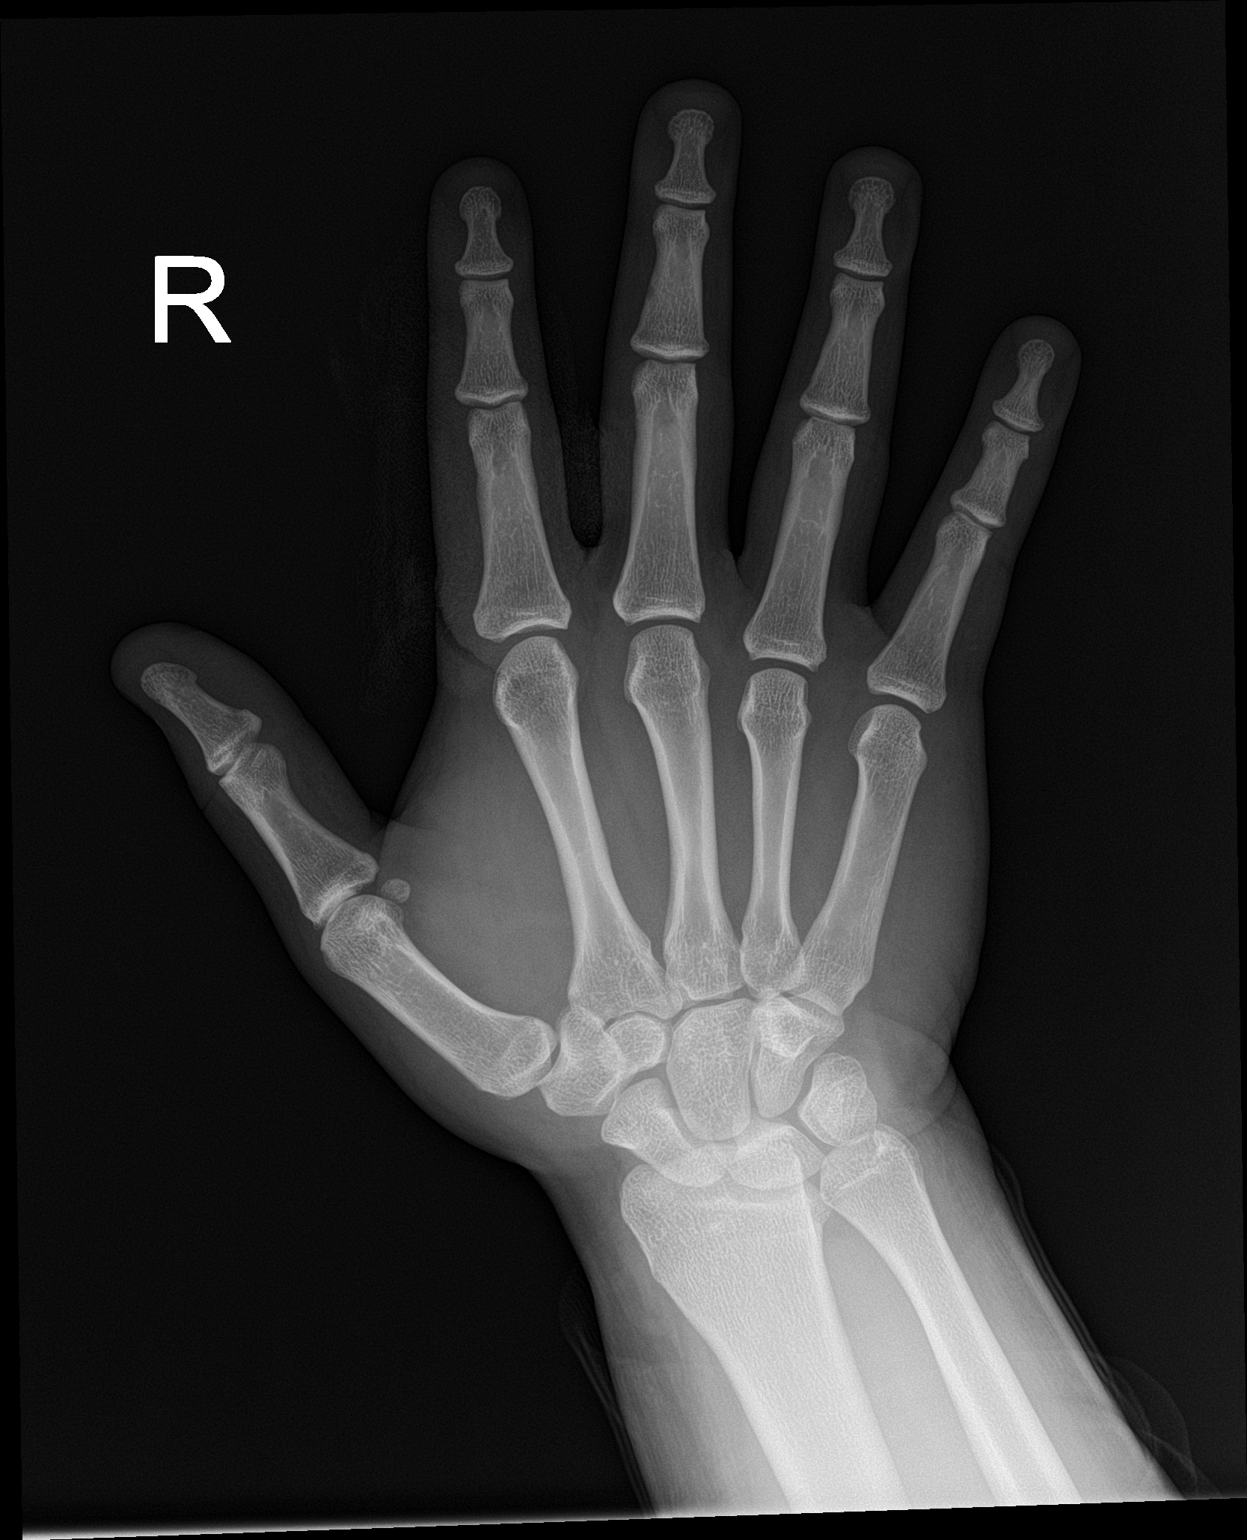

[hand obl]
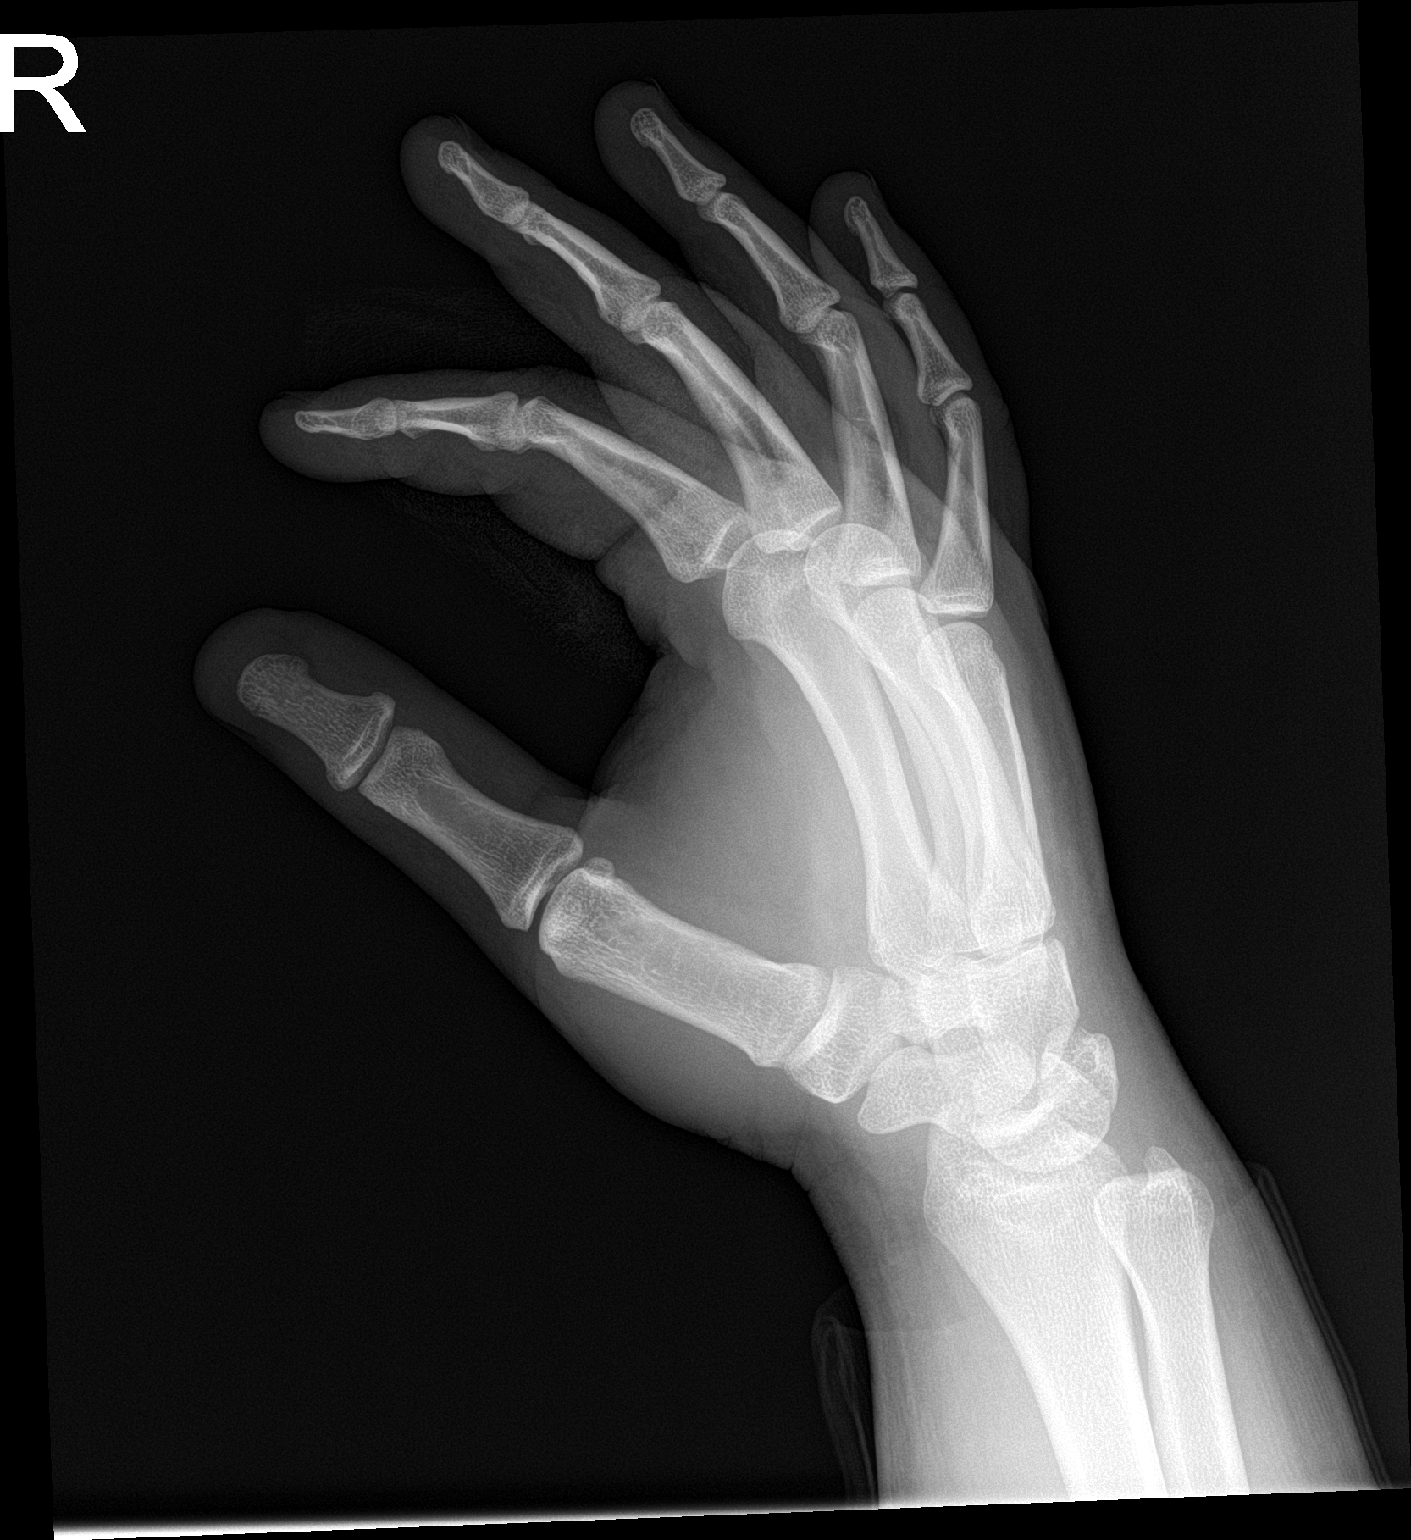

[hand lat]
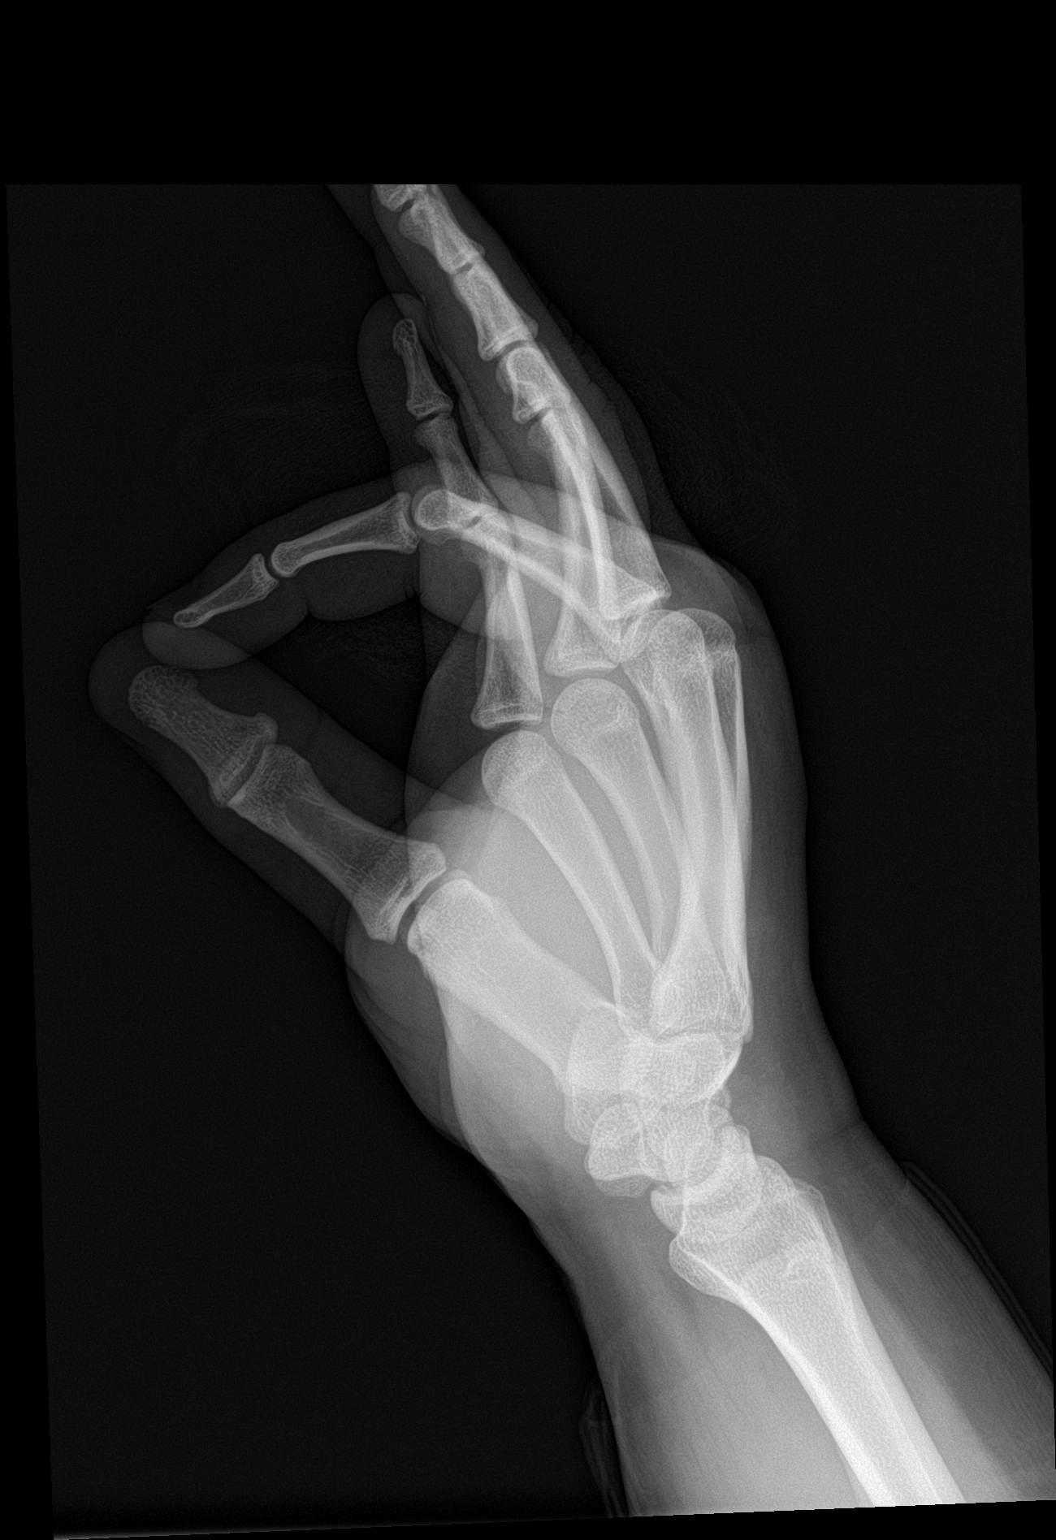

[3 of 3 positions shown; findings below may reference images not displayed]

FINDINGS: Bandaging material overlies the right index finger. There is an
underlying soft tissue defect, consistent with laceration. No
radiopaque foreign body. No acute fracture or dislocation. Joint
spaces well maintained without evidence of significant degenerative
or erosive arthropathy. Osseous mineralization normal.
IMPRESSION: 1. Soft tissue laceration at the base of the right second digit. No
radiopaque foreign body.
2. No acute fracture or dislocation.

## 2020-01-04 DIAGNOSIS — E1159 Type 2 diabetes mellitus with other circulatory complications: Secondary | ICD-10-CM | POA: Insufficient documentation

## 2021-01-10 DIAGNOSIS — G4733 Obstructive sleep apnea (adult) (pediatric): Secondary | ICD-10-CM | POA: Insufficient documentation

## 2021-08-14 ENCOUNTER — Emergency Department (HOSPITAL_COMMUNITY): Payer: Medicaid Other

## 2021-08-14 ENCOUNTER — Other Ambulatory Visit: Payer: Self-pay

## 2021-08-14 ENCOUNTER — Encounter (HOSPITAL_COMMUNITY): Payer: Self-pay | Admitting: Emergency Medicine

## 2021-08-14 ENCOUNTER — Emergency Department (HOSPITAL_COMMUNITY)
Admission: EM | Admit: 2021-08-14 | Discharge: 2021-08-14 | Disposition: A | Discharge status: Home / Self Care | Payer: Medicaid Other | Attending: Emergency Medicine | Admitting: Emergency Medicine

## 2021-08-14 DIAGNOSIS — N200 Calculus of kidney: Secondary | ICD-10-CM | POA: Diagnosis not present

## 2021-08-14 DIAGNOSIS — R109 Unspecified abdominal pain: Secondary | ICD-10-CM | POA: Diagnosis present

## 2021-08-14 LAB — URINALYSIS, ROUTINE W REFLEX MICROSCOPIC
Bilirubin Urine: NEGATIVE
Glucose, UA: NEGATIVE mg/dL
Ketones, ur: NEGATIVE mg/dL
Leukocytes,Ua: NEGATIVE
Nitrite: NEGATIVE
Protein, ur: 100 mg/dL — AB
RBC / HPF: >50 RBC/hpf — ABNORMAL HIGH (ref 0–5)
Specific Gravity, Urine: 1.029 (ref 1.005–1.030)
pH: 5 (ref 5.0–8.0)

## 2021-08-14 LAB — BASIC METABOLIC PANEL
Anion gap: 7 (ref 5–15)
BUN: 15 mg/dL (ref 6–20)
CO2: 25 mmol/L (ref 22–32)
Calcium: 9.5 mg/dL (ref 8.9–10.3)
Chloride: 106 mmol/L (ref 98–111)
Creatinine, Ser: 1.07 mg/dL (ref 0.61–1.24)
GFR, Estimated: >60 mL/min (ref >60)
Glucose, Bld: 105 mg/dL — ABNORMAL HIGH (ref 70–99)
Potassium: 3.8 mmol/L (ref 3.5–5.1)
Sodium: 138 mmol/L (ref 135–145)

## 2021-08-14 LAB — CBC WITH DIFFERENTIAL/PLATELET
Abs Immature Granulocytes: 0.01 10*3/uL (ref 0.00–0.07)
Basophils Absolute: 0 10*3/uL (ref 0.0–0.1)
Basophils Relative: 0 %
Eosinophils Absolute: 0.1 10*3/uL (ref 0.0–0.5)
Eosinophils Relative: 2 %
HCT: 43.6 % (ref 39.0–52.0)
Hemoglobin: 15 g/dL (ref 13.0–17.0)
Immature Granulocytes: 0 %
Lymphocytes Relative: 24 %
Lymphs Abs: 1.4 10*3/uL (ref 0.7–4.0)
MCH: 30.9 pg (ref 26.0–34.0)
MCHC: 34.4 g/dL (ref 30.0–36.0)
MCV: 89.7 fL (ref 80.0–100.0)
Monocytes Absolute: 0.6 10*3/uL (ref 0.1–1.0)
Monocytes Relative: 11 %
Neutro Abs: 3.7 10*3/uL (ref 1.7–7.7)
Neutrophils Relative %: 63 %
Platelets: 271 10*3/uL (ref 150–400)
RBC: 4.86 MIL/uL (ref 4.22–5.81)
RDW: 12.9 % (ref 11.5–15.5)
WBC: 5.9 10*3/uL (ref 4.0–10.5)
nRBC: 0 % (ref 0.0–0.2)

## 2021-08-14 MED ORDER — SODIUM CHLORIDE 0.9 % IV BOLUS
1000.0000 mL | Freq: Once | INTRAVENOUS | Status: AC
  Administered 2021-08-14: 1000 mL via INTRAVENOUS

## 2021-08-14 MED ORDER — KETOROLAC TROMETHAMINE 30 MG/ML IJ SOLN
30.0000 mg | Freq: Once | INTRAMUSCULAR | Status: AC
  Administered 2021-08-14: 30 mg via INTRAVENOUS
  Filled 2021-08-14: qty 1

## 2021-08-14 MED ORDER — ONDANSETRON HCL 4 MG/2ML IJ SOLN
4.0000 mg | Freq: Once | INTRAMUSCULAR | Status: AC
  Administered 2021-08-14: 4 mg via INTRAVENOUS
  Filled 2021-08-14: qty 2

## 2021-08-14 MED ORDER — KETOROLAC TROMETHAMINE 10 MG PO TABS: 10.0000 mg | ORAL_TABLET | Freq: Four times a day (QID) | ORAL | 0 refills | Status: DC | PRN

## 2021-08-14 MED ORDER — TAMSULOSIN HCL 0.4 MG PO CAPS: 0.4000 mg | ORAL_CAPSULE | Freq: Every day | ORAL | 0 refills | Status: DC

## 2021-08-14 NOTE — ED Triage Notes (Signed)
Pt presents with right flank with hematuria that started this am.

## 2021-08-14 NOTE — ED Provider Notes (Signed)
[facility] EMERGENCY DEPARTMENT Provider Note   CSN: [id number] Arrival date & time: 08/14/21  1232     History  Chief Complaint  Patient presents with   Flank Pain    Steven Barrera is a 26 y.o. male.   Flank Pain    Patient presents with right flank pain.  This started acutely this morning was associated with hematuria.  Denies any dysuria or penile discharge, patient is in a monogamous relationship with his wife.  He denies any abdominal pain, no nausea or vomiting.  No history of previous kidney stones, status post appendectomy no other abdominal surgeries.  Initially was in severe pain, this is actually resolved while in the ED waiting room.  Home Medications Prior to Admission medications   Medication Sig Start Date End Date Taking? Authorizing Provider  ketorolac (TORADOL) 10 MG tablet Take 1 tablet (10 mg total) by mouth every 6 (six) hours as needed. 08/14/21  Yes Theron Arista, PA-C  tamsulosin (FLOMAX) 0.4 MG CAPS capsule Take 1 capsule (0.4 mg total) by mouth daily after supper. 08/14/21  Yes Theron Arista, PA-C  amoxicillin (AMOXIL) 500 MG capsule Take 2 capsules (1,000 mg total) by mouth 2 (two) times daily. 05/22/15   Hayden Rasmussen, NP  ipratropium (ATROVENT) 0.06 % nasal spray Place 2 sprays into both nostrils 4 (four) times daily. 05/22/15   Hayden Rasmussen, NP      Allergies    Shellfish allergy    Review of Systems   Review of Systems  Genitourinary:  Positive for flank pain.    Physical Exam Updated Vital Signs BP (!) 108/53   Pulse 62   Temp 97.9 F (36.6 C) (Oral)   Resp 18   Ht 5' 10.5" (1.791 m)   Wt (!) 145.2 kg   SpO2 100%   BMI 45.27 kg/m  Physical Exam Vitals and nursing note reviewed. Exam conducted with a chaperone present.  Constitutional:      Appearance: Normal appearance.  HENT:     Head: Normocephalic and atraumatic.  Eyes:     General: No scleral icterus.       Right eye: No discharge.        Left eye: No discharge.     Extraocular  Movements: Extraocular movements intact.     Pupils: Pupils are equal, round, and reactive to light.  Cardiovascular:     Rate and Rhythm: Normal rate and regular rhythm.     Pulses: Normal pulses.     Heart sounds: Normal heart sounds. No murmur heard.    No friction rub. No gallop.  Pulmonary:     Effort: Pulmonary effort is normal. No respiratory distress.     Breath sounds: Normal breath sounds.  Abdominal:     General: Abdomen is flat. Bowel sounds are normal. There is no distension.     Palpations: Abdomen is soft.     Tenderness: There is no abdominal tenderness. There is right CVA tenderness.  Skin:    General: Skin is warm and dry.     Coloration: Skin is not jaundiced.  Neurological:     Mental Status: He is alert. Mental status is at baseline.     Coordination: Coordination normal.     ED Results / Procedures / Treatments   Labs (all labs ordered are listed, but only abnormal results are displayed) Labs Reviewed  URINALYSIS, ROUTINE W REFLEX MICROSCOPIC - Abnormal; Notable for the following components:      Result Value  Color, Urine AMBER (*)    APPearance CLOUDY (*)    Hgb urine dipstick LARGE (*)    Protein, ur 100 (*)    RBC / HPF >50 (*)    Bacteria, UA FEW (*)    All other components within normal limits  BASIC METABOLIC PANEL - Abnormal; Notable for the following components:   Glucose, Bld 105 (*)    All other components within normal limits  CBC WITH DIFFERENTIAL/PLATELET    EKG None  Radiology CT Renal Stone Study  Result Date: 08/14/2021 CLINICAL DATA:  Flank pain, kidney stone suspected EXAM: CT ABDOMEN AND PELVIS WITHOUT CONTRAST TECHNIQUE: Multidetector CT imaging of the abdomen and pelvis was performed following the standard protocol without IV contrast. RADIATION DOSE REDUCTION: This exam was performed according to the departmental dose-optimization program which includes automated exposure control, adjustment of the mA and/or kV according to  patient size and/or use of iterative reconstruction technique. COMPARISON:  CT abdomen/pelvis April 06, 2015. FINDINGS: Lower chest: No acute abnormality. Hepatobiliary: No focal liver abnormality is seen. No gallstones, gallbladder wall thickening, or biliary dilatation. Phrygian cap morphology, anatomic variant. Pancreas: Unremarkable. No pancreatic ductal dilatation or surrounding inflammatory changes. Spleen: Normal in size without focal abnormality. Adrenals/Urinary Tract: 6 mm interpolar calculus. 5 mm calculus at the ureteropelvic junction with possible trace hydronephrosis. No left-sided renal calculus or left hydronephrosis. Decompressed bladder. Stomach/Bowel: Stomach is within normal limits. Suspected prior appendectomy. No evidence of bowel wall thickening, distention, or inflammatory changes. Vascular/Lymphatic: No significant vascular findings are present. No enlarged abdominal or pelvic lymph nodes. Reproductive: Prostate is unremarkable. Other: No abdominal wall hernia or abnormality. No abdominopelvic ascites. Musculoskeletal: No fracture is seen. IMPRESSION: 1. 5 mm calculus at the right ureteropelvic junction with possible trace right hydronephrosis. 2. Additional 6 mm right interpolar renal calculus. Electronically Signed   By: Feliberto Harts M.D.   On: 08/14/2021 14:05    Procedures Procedures    Medications Ordered in ED Medications  ondansetron (ZOFRAN) injection 4 mg (4 mg Intravenous Given 08/14/21 1419)  ketorolac (TORADOL) 30 MG/ML injection 30 mg (30 mg Intravenous Given 08/14/21 1419)  sodium chloride 0.9 % bolus 1,000 mL (1,000 mLs Intravenous New Bag/Given 08/14/21 1420)    ED Course/ Medical Decision Making/ A&P                           Medical Decision Making Amount and/or Complexity of Data Reviewed Labs: ordered. Radiology: ordered.  Risk Prescription drug management.   Patient presents due to right-sided flank pain.  Started acutely this morning,  differential includes but not limited to nephrolithiasis, UTI, pyelonephritis, urethritis.  On exam patient has some mild right CVA tenderness but no abdominal tenderness.  Abdomen is soft without rigidity or guarding, patient is afebrile.  Hemodynamically stable without any tachycardia or tachypnea.  I ordered and reviewed laboratory work-up.  Per my interpretation no leukocytosis or underlying anemia.  BMP is without any gross electrolyte derangement or AKI.  UA with some proteinuria but no underlying infection or UTI.  CT renal study ordered and viewed by myself.  I agree with radiologist rotation.  Patient has a 5 mm calculus in right uteropelvic junction with mild hydronephrosis.    On my examination patient is entirely asymptomatic.  I did order patient 1L NS, Zofran and Toradol.  I do not think this is a septic stone.  Considered antibiotics but not indicated based on noninfected urine and no  leukocytosis.  Creatinine is within normal limits, no significant hydronephrosis.  Will prescribe Flomax and pain medicine for the patient as well as urology follow-up.   Impression -nephrolithiasis        Final Clinical Impression(s) / ED Diagnoses Final diagnoses:  Nephrolithiasis    Rx / DC Orders ED Discharge Orders          Ordered    tamsulosin (FLOMAX) 0.4 MG CAPS capsule  Daily after supper        08/14/21 1502    ketorolac (TORADOL) 10 MG tablet  Every 6 hours PRN        08/14/21 1502              Theron Arista, PA-C 08/14/21 1503    Gloris Manchester, MD 08/16/21 1410

## 2021-08-14 NOTE — Discharge Instructions (Addendum)
You are seen in the emergency department today for right flank pain.  As we discussed you have a 5 mm kidney stone in your right ureter.  Take the Flomax in the evenings with food and water.  Take the Toradol every 6 hours as needed for pain.  Follow-up with urology, schedule appointment for about a week now.  The stone should pass on its own, if you have inability to urinate, fevers, vomiting or new and concerning symptoms return to the emergency department.

## 2021-09-06 ENCOUNTER — Telehealth: Payer: Self-pay

## 2021-09-06 ENCOUNTER — Other Ambulatory Visit: Payer: Self-pay | Admitting: Urology

## 2021-09-06 DIAGNOSIS — N201 Calculus of ureter: Secondary | ICD-10-CM

## 2021-09-06 NOTE — Telephone Encounter (Signed)
Called patient and left message to return call to instruct patient to get KUB prior.

## 2021-09-06 NOTE — Telephone Encounter (Signed)
-----   Message from Milderd Meager, MD sent at 09/06/2021  9:12 AM EDT ----- Please have patient get a KUB prior to visit tomorrow morning.  KUB ordered placed at [facility].

## 2021-09-07 ENCOUNTER — Ambulatory Visit (INDEPENDENT_AMBULATORY_CARE_PROVIDER_SITE_OTHER): Payer: Medicaid Other | Admitting: Urology

## 2021-09-07 ENCOUNTER — Emergency Department (HOSPITAL_COMMUNITY)
Admission: EM | Admit: 2021-09-07 | Discharge: 2021-09-07 | Disposition: A | Discharge status: Other Acute Inpatient Hospital | Payer: Medicaid Other

## 2021-09-07 ENCOUNTER — Encounter: Payer: Self-pay | Admitting: Urology

## 2021-09-07 ENCOUNTER — Ambulatory Visit (HOSPITAL_COMMUNITY)
Admission: RE | Admit: 2021-09-07 | Discharge: 2021-09-07 | Disposition: A | Discharge status: Home / Self Care | Payer: Medicaid Other | Source: Ambulatory Visit | Attending: Urology | Admitting: Urology

## 2021-09-07 VITALS — BP 136/86 | HR 74 | Ht 71.0 in | Wt 327.0 lb

## 2021-09-07 DIAGNOSIS — N201 Calculus of ureter: Secondary | ICD-10-CM

## 2021-09-07 DIAGNOSIS — N200 Calculus of kidney: Secondary | ICD-10-CM | POA: Insufficient documentation

## 2021-09-07 DIAGNOSIS — N202 Calculus of kidney with calculus of ureter: Secondary | ICD-10-CM | POA: Diagnosis not present

## 2021-09-07 LAB — URINALYSIS, ROUTINE W REFLEX MICROSCOPIC
Bilirubin, UA: NEGATIVE
Glucose, UA: NEGATIVE
Ketones, UA: NEGATIVE
Leukocytes,UA: NEGATIVE
Nitrite, UA: NEGATIVE
Protein,UA: NEGATIVE
Specific Gravity, UA: 1.02 (ref 1.005–1.030)
Urobilinogen, Ur: 0.2 mg/dL (ref 0.2–1.0)
pH, UA: 5 (ref 5.0–7.5)

## 2021-09-07 LAB — MICROSCOPIC EXAMINATION
Bacteria, UA: NONE SEEN
Epithelial Cells (non renal): NONE SEEN /hpf (ref 0–10)
RBC, Urine: NONE SEEN /hpf (ref 0–2)
Renal Epithel, UA: NONE SEEN /hpf
WBC, UA: NONE SEEN /hpf (ref 0–5)

## 2021-09-07 NOTE — Progress Notes (Signed)
Assessment: 1. Ureteral calculus, right   2. Nephrolithiasis     Plan: I personally reviewed the records from the patient's ER visit on 08/14/2021 including provider notes, lab results, and imaging results. I personally reviewed the CT study from 08/14/2021 showing a 4 x 4 x 3 mm right UPJ calculus and a right lower pole renal calculus. He has been asymptomatic for several weeks. Recommend evaluation with a KUB to determine if he has passed the stone. Stone prevention discussed and information provided. Return to office prn  Chief Complaint:  Chief Complaint  Patient presents with   Nephrolithiasis    History of Present Illness:  Steven Barrera is a 26 y.o. male who is seen for evaluation of a right UPJ calculus. He presented to the emergency room on 08/14/2021 with right-sided flank pain. CT imaging demonstrated a right renal calculus and a 5 mm calculus at the right UPJ with minimal hydronephrosis. He has not had any flank pain since the ER visit.  He is not aware of passing a stone but was not straining his urine.  No lower urinary tract symptoms.  No dysuria or gross hematuria. No prior history of kidney stones.   Past Medical History:  Past Medical History:  Diagnosis Date   Childhood asthma     Past Surgical History:  Past Surgical History:  Procedure Laterality Date   APPENDECTOMY  04/06/2015   LAPAROSCOPIC APPENDECTOMY N/A 04/06/2015   Procedure: APPENDECTOMY LAPAROSCOPIC;  Surgeon: Jimmye Norman, MD;  Location: [facility] OR;  Service: General;  Laterality: N/A;   TENDON REPAIR Right 07/31/2014   Procedure: WOUND EXPLORATION WITH EXTENSIVE TENDON REPAIR OF RIGHT INDEX FINGER;  Surgeon: Knute Neu, MD;  Location: [facility] OR;  Service: Plastics;  Laterality: Right;    Allergies:  Allergies  Allergen Reactions   Shellfish Allergy Swelling    Facial swelling    Family History:  History reviewed. No pertinent family history.  Social History:  Social History   Tobacco  Use   Smoking status: Some Days    Types: Cigars   Smokeless tobacco: Never  Substance Use Topics   Alcohol use: Yes    Comment: 04/06/2015 "I'll drink on holidays/birthdays"   Drug use: No    Review of symptoms:  Constitutional:  Negative for unexplained weight loss, night sweats, fever, chills ENT:  Negative for nose bleeds, sinus pain, painful swallowing CV:  Negative for chest pain, shortness of breath, exercise intolerance, palpitations, loss of consciousness Resp:  Negative for cough, wheezing, shortness of breath GI:  Negative for nausea, vomiting, diarrhea, bloody stools GU:  Positives noted in HPI; otherwise negative for gross hematuria, dysuria, urinary incontinence Neuro:  Negative for seizures, poor balance, limb weakness, slurred speech Psych:  Negative for lack of energy, depression, anxiety Endocrine:  Negative for polydipsia, polyuria, symptoms of hypoglycemia (dizziness, hunger, sweating) Hematologic:  Negative for anemia, purpura, petechia, prolonged or excessive bleeding, use of anticoagulants  Allergic:  Negative for difficulty breathing or choking as a result of exposure to anything; no shellfish allergy; no allergic response (rash/itch) to materials, foods  Physical exam: BP 136/86   Pulse 74   Ht 5\' 11"  (1.803 m)   Wt (!) 327 lb (148.3 kg)   BMI 45.61 kg/m  GENERAL APPEARANCE:  Well appearing, well developed, well nourished, NAD HEENT: Atraumatic, Normocephalic, oropharynx clear. NECK: Supple without lymphadenopathy or thyromegaly. LUNGS: Clear to auscultation bilaterally. HEART: Regular Rate and Rhythm without murmurs, gallops, or rubs. ABDOMEN: Soft, non-tender, No Masses.  EXTREMITIES: Moves all extremities well.  Without clubbing, cyanosis, or edema. NEUROLOGIC:  Alert and oriented x 3, normal gait, CN II-XII grossly intact.  MENTAL STATUS:  Appropriate. BACK:  Non-tender to palpation.  No CVAT SKIN:  Warm, dry and intact.    Results: U/A:  trace  blood

## 2021-09-25 ENCOUNTER — Telehealth: Payer: Self-pay

## 2021-09-25 NOTE — Telephone Encounter (Signed)
Patient was having pain in his lower back and abdomen this morning, he rated the pain around a 7 on a scale of 1-10.  Patient is able to void, he denies any fever, or other urinary symptoms.  He states he takes the tamsulosin and ketorolac rx as needed, he only takes the tamsulosin about three times   His pain comes and goes and has eased up at this point.  Do you want the patient to come in or do you recommend any other treatment?

## 2021-09-25 NOTE — Telephone Encounter (Signed)
Patient is aware of MD's response he will reach out if his symptoms persist or worsen.

## 2021-12-03 DIAGNOSIS — Z6841 Body Mass Index (BMI) 40.0 and over, adult: Secondary | ICD-10-CM | POA: Diagnosis not present

## 2021-12-03 DIAGNOSIS — Z01818 Encounter for other preprocedural examination: Secondary | ICD-10-CM | POA: Diagnosis not present

## 2021-12-20 DIAGNOSIS — G4733 Obstructive sleep apnea (adult) (pediatric): Secondary | ICD-10-CM | POA: Diagnosis not present

## 2021-12-20 DIAGNOSIS — E1365 Other specified diabetes mellitus with hyperglycemia: Secondary | ICD-10-CM | POA: Diagnosis not present

## 2021-12-20 DIAGNOSIS — I10 Essential (primary) hypertension: Secondary | ICD-10-CM | POA: Diagnosis not present

## 2021-12-20 DIAGNOSIS — Z6841 Body Mass Index (BMI) 40.0 and over, adult: Secondary | ICD-10-CM | POA: Diagnosis not present

## 2021-12-20 DIAGNOSIS — K3580 Unspecified acute appendicitis: Secondary | ICD-10-CM | POA: Diagnosis not present

## 2022-01-23 DIAGNOSIS — G4733 Obstructive sleep apnea (adult) (pediatric): Secondary | ICD-10-CM | POA: Diagnosis not present

## 2022-01-25 DIAGNOSIS — Z713 Dietary counseling and surveillance: Secondary | ICD-10-CM | POA: Diagnosis not present

## 2022-01-25 DIAGNOSIS — Z903 Acquired absence of stomach [part of]: Secondary | ICD-10-CM | POA: Diagnosis not present

## 2022-03-04 ENCOUNTER — Ambulatory Visit: Payer: Medicaid Other | Admitting: Urology

## 2022-03-04 ENCOUNTER — Encounter: Payer: Self-pay | Admitting: Urology

## 2022-03-04 VITALS — BP 145/84 | HR 72 | Ht 70.0 in | Wt 303.0 lb

## 2022-03-04 DIAGNOSIS — R109 Unspecified abdominal pain: Secondary | ICD-10-CM | POA: Diagnosis not present

## 2022-03-04 DIAGNOSIS — N200 Calculus of kidney: Secondary | ICD-10-CM

## 2022-03-04 DIAGNOSIS — R8290 Unspecified abnormal findings in urine: Secondary | ICD-10-CM

## 2022-03-04 LAB — URINALYSIS, MICROSCOPIC ONLY
Bacteria, UA: NONE SEEN
Casts: NONE SEEN /lpf
Epithelial Cells (non renal): >10 /hpf — AB (ref 0–10)

## 2022-03-04 LAB — URINALYSIS
Bilirubin, UA: NEGATIVE
Glucose, UA: NEGATIVE mg/dL
Ketones, UA: NEGATIVE
Leukocytes, UA: NEGATIVE
Nitrite, UA: NEGATIVE
Protein, UA: POSITIVE — AB
Spec Grav, UA: 1.025 (ref 1.010–1.025)
Urobilinogen, UA: 1 E.U./dL
pH, UA: 6 (ref 5.0–8.0)

## 2022-03-04 MED ORDER — HYDROCODONE-ACETAMINOPHEN 5-325 MG PO TABS: 1.0000 | ORAL_TABLET | Freq: Four times a day (QID) | ORAL | 0 refills | Status: DC | PRN

## 2022-03-04 NOTE — Progress Notes (Signed)
Assessment: 1. Flank pain   2. Nephrolithiasis   3. Abnormal urine findings     Plan: Microscopic U/A sent today Schedule for CT renal stone protocol at [facility] for pain medication provided Will call with results of CT and to arrange next visit  Chief Complaint:  Chief Complaint  Patient presents with   Nephrolithiasis    History of Present Illness:  Steven Barrera is a 27 y.o. male who is seen for evaluation of right-sided flank pain and history of nephrolithiasis. He was last seen in 9/23 for a right UPJ calculus. He presented to the emergency room on 08/14/2021 with right-sided flank pain. CT imaging demonstrated a right renal calculus and a 5 mm calculus at the right UPJ with minimal hydronephrosis. At the time of his visit, he had not had any flank pain since the ER visit.  He was not aware of passing a stone but had not been straining his urine.  No lower urinary tract symptoms.  No dysuria or gross hematuria. No prior history of kidney stones. KUB from 9/23 did not show any obvious calcifications in the area of the right renal shadow or proximal ureter.  He noted onset of some right-sided flank and back pain with radiation into the right groin approximately 3 weeks ago.  His symptoms have been intermittent in nature.  He is also noted some dark strong smelling urine.  No fevers or chills.  No gross hematuria or dysuria.  He has not required any pain medication for his symptoms.  No recent imaging.   Portions of the above documentation were copied from a prior visit for review purposes only.   Past Medical History:  Past Medical History:  Diagnosis Date   Childhood asthma     Past Surgical History:  Past Surgical History:  Procedure Laterality Date   APPENDECTOMY  04/06/2015   LAPAROSCOPIC APPENDECTOMY N/A 04/06/2015   Procedure: APPENDECTOMY LAPAROSCOPIC;  Surgeon: Judeth Horn, MD;  Location: [facility];  Service: General;  Laterality: N/A;   TENDON REPAIR  Right 07/31/2014   Procedure: WOUND EXPLORATION WITH EXTENSIVE TENDON REPAIR OF RIGHT INDEX FINGER;  Surgeon: Dayna Barker, MD;  Location: [facility];  Service: Plastics;  Laterality: Right;    Allergies:  Allergies  Allergen Reactions   Shellfish Allergy Swelling    Facial swelling    Family History:  No family history on file.  Social History:  Social History   Tobacco Use   Smoking status: Some Days    Types: Cigars   Smokeless tobacco: Never  Substance Use Topics   Alcohol use: Yes    Comment: 04/06/2015 "I'll drink on holidays/birthdays"   Drug use: No    ROS: Constitutional:  Negative for fever, chills, weight loss CV: Negative for chest pain, previous MI, hypertension Respiratory:  Negative for shortness of breath, wheezing, sleep apnea, frequent cough GI:  Negative for nausea, vomiting, bloody stool, GERD  Physical exam: BP (!) 145/84   Pulse 72   Ht '5\' 10"'$  (1.778 m)   Wt (!) 303 lb (137.4 kg)   BMI 43.48 kg/m  GENERAL APPEARANCE:  Well appearing, well developed, well nourished, NAD HEENT:  Atraumatic, normocephalic, oropharynx clear NECK:  Supple without lymphadenopathy or thyromegaly ABDOMEN:  Soft, non-tender, no masses EXTREMITIES:  Moves all extremities well, without clubbing, cyanosis, or edema NEUROLOGIC:  Alert and oriented x 3, normal gait, CN II-XII grossly intact MENTAL STATUS:  appropriate BACK:  Non-tender to palpation, No CVAT SKIN:  Warm, dry, and intact   Results: U/A dipstick: 2+ blood, trace protein

## 2022-03-19 ENCOUNTER — Encounter (HOSPITAL_COMMUNITY): Payer: Self-pay

## 2022-03-19 ENCOUNTER — Emergency Department (HOSPITAL_COMMUNITY): Payer: Medicaid Other

## 2022-03-19 ENCOUNTER — Emergency Department (HOSPITAL_COMMUNITY)
Admission: EM | Admit: 2022-03-19 | Discharge: 2022-03-19 | Disposition: A | Discharge status: Home / Self Care | Payer: Medicaid Other | Attending: Emergency Medicine | Admitting: Emergency Medicine

## 2022-03-19 ENCOUNTER — Other Ambulatory Visit: Payer: Self-pay

## 2022-03-19 DIAGNOSIS — J45909 Unspecified asthma, uncomplicated: Secondary | ICD-10-CM | POA: Insufficient documentation

## 2022-03-19 DIAGNOSIS — N23 Unspecified renal colic: Secondary | ICD-10-CM | POA: Diagnosis not present

## 2022-03-19 DIAGNOSIS — R109 Unspecified abdominal pain: Secondary | ICD-10-CM | POA: Diagnosis present

## 2022-03-19 DIAGNOSIS — N132 Hydronephrosis with renal and ureteral calculous obstruction: Secondary | ICD-10-CM | POA: Diagnosis not present

## 2022-03-19 DIAGNOSIS — F17290 Nicotine dependence, other tobacco product, uncomplicated: Secondary | ICD-10-CM | POA: Insufficient documentation

## 2022-03-19 HISTORY — DX: Calculus of kidney: N20.0

## 2022-03-19 LAB — BASIC METABOLIC PANEL
Anion gap: 10 (ref 5–15)
BUN: 15 mg/dL (ref 6–20)
CO2: 25 mmol/L (ref 22–32)
Calcium: 9.4 mg/dL (ref 8.9–10.3)
Chloride: 104 mmol/L (ref 98–111)
Creatinine, Ser: 1.25 mg/dL — ABNORMAL HIGH (ref 0.61–1.24)
GFR, Estimated: >60 mL/min (ref >60)
Glucose, Bld: 108 mg/dL — ABNORMAL HIGH (ref 70–99)
Potassium: 3.7 mmol/L (ref 3.5–5.1)
Sodium: 139 mmol/L (ref 135–145)

## 2022-03-19 LAB — CBC
HCT: 45.6 % (ref 39.0–52.0)
Hemoglobin: 15.5 g/dL (ref 13.0–17.0)
MCH: 30.6 pg (ref 26.0–34.0)
MCHC: 34 g/dL (ref 30.0–36.0)
MCV: 89.9 fL (ref 80.0–100.0)
Platelets: 261 10*3/uL (ref 150–400)
RBC: 5.07 MIL/uL (ref 4.22–5.81)
RDW: 12.4 % (ref 11.5–15.5)
WBC: 5.6 10*3/uL (ref 4.0–10.5)
nRBC: 0 % (ref 0.0–0.2)

## 2022-03-19 LAB — URINALYSIS, ROUTINE W REFLEX MICROSCOPIC
Bilirubin Urine: NEGATIVE
Glucose, UA: NEGATIVE mg/dL
Ketones, ur: NEGATIVE mg/dL
Leukocytes,Ua: NEGATIVE
Nitrite: NEGATIVE
Specific Gravity, Urine: >1.03 — ABNORMAL HIGH (ref 1.005–1.030)
pH: 6 (ref 5.0–8.0)

## 2022-03-19 LAB — URINALYSIS, MICROSCOPIC (REFLEX)

## 2022-03-19 MED ORDER — OXYCODONE-ACETAMINOPHEN 5-325 MG PO TABS
1.0000 | ORAL_TABLET | ORAL | Status: DC | PRN
  Administered 2022-03-19: 1 via ORAL
  Filled 2022-03-19: qty 1

## 2022-03-19 MED ORDER — KETOROLAC TROMETHAMINE 15 MG/ML IJ SOLN
15.0000 mg | Freq: Once | INTRAMUSCULAR | Status: AC
  Administered 2022-03-19: 15 mg via INTRAMUSCULAR
  Filled 2022-03-19: qty 1

## 2022-03-19 MED ORDER — TAMSULOSIN HCL 0.4 MG PO CAPS: 0.4000 mg | ORAL_CAPSULE | Freq: Every day | ORAL | 0 refills | Status: DC

## 2022-03-19 MED ORDER — ONDANSETRON HCL 4 MG PO TABS: 4.0000 mg | ORAL_TABLET | Freq: Three times a day (TID) | ORAL | 0 refills | Status: DC | PRN

## 2022-03-19 MED ORDER — IBUPROFEN 600 MG PO TABS: 600.0000 mg | ORAL_TABLET | Freq: Four times a day (QID) | ORAL | 0 refills | Status: DC | PRN

## 2022-03-19 MED ORDER — HYDROCODONE-ACETAMINOPHEN 5-325 MG PO TABS: 1.0000 | ORAL_TABLET | Freq: Four times a day (QID) | ORAL | 0 refills | Status: DC | PRN

## 2022-03-19 MED ORDER — ONDANSETRON 4 MG PO TBDP
4.0000 mg | ORAL_TABLET | Freq: Once | ORAL | Status: AC
  Administered 2022-03-19: 4 mg via ORAL
  Filled 2022-03-19: qty 1

## 2022-03-19 NOTE — Discharge Instructions (Addendum)
We evaluated you for your kidney stone pain.  Your CT scan shows a 1 cm kidney stone on your right kidney.  The kidney stone this large sometimes will not go through on its own.  Please call Dr. Felipa Eth to set up an appointment as soon as possible.  You may need to have a procedure to help remove the stone.  Your lab test showed a mild decrease in your kidney function.  Please be sure to drink lots of fluids.  Please take 600 mg of ibuprofen every 6 hours as needed for pain.  This should be safe since your surgery was 3 months ago.  You can also take hydrocodone if your pain is not relieved by ibuprofen.  I have prescribed you a small additional amount of hydrocodone.  Do not drive or operate machinery while taking this medication.  I have prescribed you a medicine called Zofran which you can take for nausea.  You can take this every 8 hours as needed for nausea.  I have also prescribed you a medication called Flomax which can help the kidney stone pass through your urinary tract.  Please follow-up closely with Dr. Felipa Eth.  Please return if you develop any worsening pain, uncontrolled vomiting, fevers or chills, painful urination, lightheadedness or dizziness, or any other concerning symptoms.

## 2022-03-19 NOTE — ED Provider Notes (Signed)
[facility] Provider Note  CSN: [id number] Arrival date & time: 03/19/22 1502  Chief Complaint(s) Flank Pain  HPI Steven Barrera is a 27 y.o. male with history of prior kidney stone presenting to the emergency department with right flank pain.  He reports he has had right flank pain for around 2 to 3 weeks.  He saw his urologist who recommended getting a CT scan as outpatient.  He has not had it yet.  He reports his pain worsened today so he came to the emergency department.  Occasionally has nausea and vomiting, nothing currently.  He reports his pain is improved with the Percocet.  He is not taking anything else for his pain at home besides Norco.  No modifying factors.  No dysuria, fevers or chills.  No lightheadedness or dizziness.   Past Medical History Past Medical History:  Diagnosis Date   Childhood asthma    Kidney stone    Patient Active Problem List   Diagnosis Date Noted   Nephrolithiasis 09/07/2021   Acute appendicitis 04/06/2015   Home Medication(s) Prior to Admission medications   Medication Sig Start Date End Date Taking? Authorizing Provider  ibuprofen (ADVIL) 600 MG tablet Take 1 tablet (600 mg total) by mouth every 6 (six) hours as needed. 03/19/22  Yes Cristie Hem, MD  ondansetron (ZOFRAN) 4 MG tablet Take 1 tablet (4 mg total) by mouth every 8 (eight) hours as needed for nausea or vomiting. 03/19/22  Yes Cristie Hem, MD  tamsulosin (FLOMAX) 0.4 MG CAPS capsule Take 1 capsule (0.4 mg total) by mouth daily. 03/19/22  Yes Cristie Hem, MD  amoxicillin (AMOXIL) 500 MG capsule Take 2 capsules (1,000 mg total) by mouth 2 (two) times daily. Patient not taking: Reported on 09/07/2021 05/22/15   Janne Napoleon, NP  HYDROcodone-acetaminophen (NORCO/VICODIN) 5-325 MG tablet Take 1 tablet by mouth every 6 (six) hours as needed for moderate pain. 03/19/22   Cristie Hem, MD  ipratropium (ATROVENT) 0.06 % nasal  spray Place 2 sprays into both nostrils 4 (four) times daily. Patient not taking: Reported on 09/07/2021 05/22/15   Janne Napoleon, NP  ketorolac (TORADOL) 10 MG tablet Take 1 tablet (10 mg total) by mouth every 6 (six) hours as needed. Patient not taking: Reported on 03/04/2022 08/14/21   Sherrill Raring, PA-C                                                                                                                                    Past Surgical History Past Surgical History:  Procedure Laterality Date   APPENDECTOMY  04/06/2015   LAPAROSCOPIC APPENDECTOMY N/A 04/06/2015   Procedure: APPENDECTOMY LAPAROSCOPIC;  Surgeon: Judeth Horn, MD;  Location: [facility];  Service: General;  Laterality: N/A;   TENDON REPAIR Right 07/31/2014   Procedure: WOUND EXPLORATION WITH EXTENSIVE TENDON REPAIR OF RIGHT INDEX FINGER;  Surgeon: Dayna Barker,  MD;  Location: [facility];  Service: Plastics;  Laterality: Right;   Family History History reviewed. No pertinent family history.  Social History Social History   Tobacco Use   Smoking status: Some Days    Types: Cigars   Smokeless tobacco: Never  Substance Use Topics   Alcohol use: Yes    Comment: 04/06/2015 "I'll drink on holidays/birthdays"   Drug use: No   Allergies Shellfish allergy  Review of Systems Review of Systems  All other systems reviewed and are negative.   Physical Exam Vital Signs  I have reviewed the triage vital signs BP (!) 147/56 (BP Location: Right Arm)   Pulse 80   Temp 97.6 F (36.4 C) (Oral)   Resp 15   Ht '5\' 10"'$  (1.778 m)   Wt (!) 137.4 kg   SpO2 99%   BMI 43.48 kg/m  Physical Exam Vitals and nursing note reviewed.  Constitutional:      General: He is not in acute distress.    Appearance: Normal appearance.  HENT:     Mouth/Throat:     Mouth: Mucous membranes are moist.  Eyes:     Conjunctiva/sclera: Conjunctivae normal.  Cardiovascular:     Rate and Rhythm: Normal rate and regular rhythm.  Pulmonary:     Effort:  Pulmonary effort is normal. No respiratory distress.     Breath sounds: Normal breath sounds.  Abdominal:     General: Abdomen is flat.     Palpations: Abdomen is soft.     Tenderness: There is no abdominal tenderness. There is no right CVA tenderness or left CVA tenderness.  Musculoskeletal:     Right lower leg: No edema.     Left lower leg: No edema.  Skin:    General: Skin is warm and dry.     Capillary Refill: Capillary refill takes less than 2 seconds.  Neurological:     Mental Status: He is alert and oriented to person, place, and time. Mental status is at baseline.  Psychiatric:        Mood and Affect: Mood normal.        Behavior: Behavior normal.     ED Results and Treatments Labs (all labs ordered are listed, but only abnormal results are displayed) Labs Reviewed  URINALYSIS, ROUTINE W REFLEX MICROSCOPIC - Abnormal; Notable for the following components:      Result Value   Specific Gravity, Urine >1.030 (*)    Hgb urine dipstick LARGE (*)    Protein, ur TRACE (*)    All other components within normal limits  BASIC METABOLIC PANEL - Abnormal; Notable for the following components:   Glucose, Bld 108 (*)    Creatinine, Ser 1.25 (*)    All other components within normal limits  URINALYSIS, MICROSCOPIC (REFLEX) - Abnormal; Notable for the following components:   Bacteria, UA FEW (*)    All other components within normal limits  CBC  Radiology CT Renal Stone Study  Result Date: 03/19/2022 CLINICAL DATA:  Abdominal and flank pain. Kidney stone suspected. Symptoms on the right. EXAM: CT ABDOMEN AND PELVIS WITHOUT CONTRAST TECHNIQUE: Multidetector CT imaging of the abdomen and pelvis was performed following the standard protocol without IV contrast. RADIATION DOSE REDUCTION: This exam was performed according to the departmental dose-optimization program  which includes automated exposure control, adjustment of the mA and/or kV according to patient size and/or use of iterative reconstruction technique. COMPARISON:  08/14/2021 FINDINGS: Lower chest: Lung bases are clear. Hepatobiliary: Liver parenchyma is normal without contrast. No calcified gallstones. Pancreas: Normal Spleen: Normal Adrenals/Urinary Tract: Adrenal glands are normal. The left kidney is normal. No stone, mass or hydronephrosis. There is swelling of the right kidney with right hydroureteronephrosis. No stone remaining in the right kidney. There is a stone or stones within the distal ureter at the UVJ measuring in total 1 cm in length with a transverse diameter of 3-4 mm. Stomach/Bowel: Stomach and small intestine are normal. Vascular/Lymphatic: Aorta and IVC are normal.  No adenopathy. Reproductive: Normal Other: No free fluid or air. Musculoskeletal: Normal IMPRESSION: Stone or stones within the distal right ureter at the UVJ measuring in total 1 cm in length with a transverse diameter of 3-4 mm. Right hydroureteronephrosis with swelling of the right kidney. No stone remaining in the right kidney. Electronically Signed   By: Nelson Chimes M.D.   On: 03/19/2022 15:39    Pertinent labs & imaging results that were available during my care of the patient were reviewed by me and considered in my medical decision making (see MDM for details).  Medications Ordered in ED Medications  oxyCODONE-acetaminophen (PERCOCET/ROXICET) 5-325 MG per tablet 1 tablet (1 tablet Oral Given 03/19/22 1535)  ketorolac (TORADOL) 15 MG/ML injection 15 mg (has no administration in time range)  ondansetron (ZOFRAN-ODT) disintegrating tablet 4 mg (4 mg Oral Given 03/19/22 1535)                                                                                                                                     Procedures Procedures  (including critical care time)  Medical Decision Making / ED  Course   MDM:  27 year old male presenting to the emergency department with flank pain.  Patient is overall well-appearing, vital signs reassuring.  No fever, tachycardia.  No CVA tenderness or abdominal tenderness.  CT scan does show right nephrolithiasis approximately 1 cm with right hydronephrosis.  No urinary symptoms, fevers, chills, to suggest urinary infection and urinalysis not concerning for urinary infection.  Does have some trace bacteria but no leukocytes or nitrites.  Labs notable for mild elevation in creatinine, patient's urine is very concentrated so suspect mild dehydration, encouraged fluid intake.  CT without evidence of other acute pathology.  Patient already has a urologist, advise calling to schedule expedited follow-up with his urologist given size of stone and that it may  not pass on its own.  Will give small amount of additional Norco, Zofran.  He has not been taking any NSAIDs, he had gastric sleeve surgery 3 months ago, should be safe to take at this point for acute pain.  Will prescribe some ibuprofen as well.  Will represcribe Flomax.  Patient does feel much better in the emergency department feels comfortable discharge. Will discharge patient to home. All questions answered. Patient comfortable with plan of discharge. Return precautions discussed with patient and specified on the after visit summary.         Additional history obtained: -External records from outside source obtained and reviewed including: Chart review including previous notes, labs, imaging, consultation notes including prior CT scans   Lab Tests: -I ordered, reviewed, and interpreted labs.   The pertinent results include:   Labs Reviewed  URINALYSIS, ROUTINE W REFLEX MICROSCOPIC - Abnormal; Notable for the following components:      Result Value   Specific Gravity, Urine >1.030 (*)    Hgb urine dipstick LARGE (*)    Protein, ur TRACE (*)    All other components within normal limits   BASIC METABOLIC PANEL - Abnormal; Notable for the following components:   Glucose, Bld 108 (*)    Creatinine, Ser 1.25 (*)    All other components within normal limits  URINALYSIS, MICROSCOPIC (REFLEX) - Abnormal; Notable for the following components:   Bacteria, UA FEW (*)    All other components within normal limits  CBC    Notable for mild AKI     Imaging Studies ordered: I ordered imaging studies including CT KUB On my interpretation imaging demonstrates right hydronephrosis and nephrolithiasis I independently visualized and interpreted imaging. I agree with the radiologist interpretation   Medicines ordered and prescription drug management: Meds ordered this encounter  Medications   oxyCODONE-acetaminophen (PERCOCET/ROXICET) 5-325 MG per tablet 1 tablet   ondansetron (ZOFRAN-ODT) disintegrating tablet 4 mg   ketorolac (TORADOL) 15 MG/ML injection 15 mg   HYDROcodone-acetaminophen (NORCO/VICODIN) 5-325 MG tablet    Sig: Take 1 tablet by mouth every 6 (six) hours as needed for moderate pain.    Dispense:  10 tablet    Refill:  0   ibuprofen (ADVIL) 600 MG tablet    Sig: Take 1 tablet (600 mg total) by mouth every 6 (six) hours as needed.    Dispense:  30 tablet    Refill:  0   tamsulosin (FLOMAX) 0.4 MG CAPS capsule    Sig: Take 1 capsule (0.4 mg total) by mouth daily.    Dispense:  30 capsule    Refill:  0   ondansetron (ZOFRAN) 4 MG tablet    Sig: Take 1 tablet (4 mg total) by mouth every 8 (eight) hours as needed for nausea or vomiting.    Dispense:  12 tablet    Refill:  0    -I have reviewed the patients home medicines and have made adjustments as needed  Social Determinants of Health:  Diagnosis or treatment significantly limited by social determinants of health: obesity   Reevaluation: After the interventions noted above, I reevaluated the patient and found that their symptoms have improved  Co morbidities that complicate the patient evaluation  Past  Medical History:  Diagnosis Date   Childhood asthma    Kidney stone       Dispostion: Disposition decision including need for hospitalization was considered, and patient discharged from emergency department.    Final Clinical Impression(s) / ED Diagnoses Final  diagnoses:  Ureteral colic     This chart was dictated using voice recognition software.  Despite best efforts to proofread,  errors can occur which can change the documentation meaning.    Cristie Hem, MD 03/19/22 [phone]

## 2022-03-19 NOTE — ED Triage Notes (Signed)
Patient reports having a kidney stone in August and about 3 weeks ago went to MD for right flank pain.  Patient reports today it got severe and it has not got better.  Reports no urinary symptoms.

## 2022-03-19 NOTE — ED Notes (Signed)
MD notified of CT reading

## 2022-03-21 ENCOUNTER — Ambulatory Visit: Payer: Medicaid Other | Admitting: Urology

## 2022-03-21 NOTE — Progress Notes (Deleted)
Assessment: 1. Ureteral calculus, right distal     Plan: I personally reviewed the CT study from 03/19/2022 with results as noted below. Diagnosis and treatment options for a ureteral calculus including spontaneous stone passage and ureteroscopic stone manipulation were discussed with Steven Barrera in detail. {He/she (caps):30048} has elected to attempt to pass the ureteral calculus. {bjsstonemanagment:27735} Follow-up in {NUMBER 1-10:22536} {days/wks/mos/yrs:310907} with ***. Return to office or ER for uncontrolled pain, vomiting, fever, or chills.   Chief Complaint:  No chief complaint on file.   History of Present Illness:  Steven Barrera is a 27 y.o. male who is seen for further evaluation of right-sided flank pain and right ureteral calculus.   He has a history of nephrolithiasis. He was seen in 9/23 for a right UPJ calculus. He presented to the emergency room on 08/14/2021 with right-sided flank pain. CT imaging demonstrated a right renal calculus and a 5 mm calculus at the right UPJ with minimal hydronephrosis. At the time of his visit, he had not had any flank pain since the ER visit.  He was not aware of passing a stone but had not been straining his urine.  No lower urinary tract symptoms.  No dysuria or gross hematuria. No prior history of kidney stones. KUB from 9/23 did not show any obvious calcifications in the area of the right renal shadow or proximal ureter.  He noted onset of some right-sided flank and back pain with radiation into the right groin approximately 4 weeks ago.  His symptoms have been intermittent in nature.  He also noted some dark strong smelling urine.  No fevers or chills.  No gross hematuria or dysuria.  He has not required any pain medication for his symptoms.   He presented to the emergency room on 03/19/2022 with increased right flank pain.  CT imaging showed a 3-4 mm calculus in the right distal ureter near the UVJ with associated  obstruction. Creatinine was slightly increased to 1.25.  Urinalysis showed 11-20 RBCs.  Portions of the above documentation were copied from a prior visit for review purposes only.   Past Medical History:  Past Medical History:  Diagnosis Date   Childhood asthma    Kidney stone     Past Surgical History:  Past Surgical History:  Procedure Laterality Date   APPENDECTOMY  04/06/2015   LAPAROSCOPIC APPENDECTOMY N/A 04/06/2015   Procedure: APPENDECTOMY LAPAROSCOPIC;  Surgeon: Judeth Horn, MD;  Location: [facility];  Service: General;  Laterality: N/A;   TENDON REPAIR Right 07/31/2014   Procedure: WOUND EXPLORATION WITH EXTENSIVE TENDON REPAIR OF RIGHT INDEX FINGER;  Surgeon: Dayna Barker, MD;  Location: [facility];  Service: Plastics;  Laterality: Right;    Allergies:  Allergies  Allergen Reactions   Shellfish Allergy Swelling    Facial swelling    Family History:  No family history on file.  Social History:  Social History   Tobacco Use   Smoking status: Some Days    Types: Cigars   Smokeless tobacco: Never  Substance Use Topics   Alcohol use: Yes    Comment: 04/06/2015 "I'll drink on holidays/birthdays"   Drug use: No    ROS: Constitutional:  Negative for fever, chills, weight loss CV: Negative for chest pain, previous MI, hypertension Respiratory:  Negative for shortness of breath, wheezing, sleep apnea, frequent cough GI:  Negative for nausea, vomiting, bloody stool, GERD  Physical exam: There were no vitals taken for this visit. GENERAL APPEARANCE:  Well appearing, well developed,  well nourished, NAD HEENT:  Atraumatic, normocephalic, oropharynx clear NECK:  Supple without lymphadenopathy or thyromegaly ABDOMEN:  Soft, non-tender, no masses EXTREMITIES:  Moves all extremities well, without clubbing, cyanosis, or edema NEUROLOGIC:  Alert and oriented x 3, normal gait, CN II-XII grossly intact MENTAL STATUS:  appropriate BACK:  Non-tender to palpation, No CVAT SKIN:   Warm, dry, and intact   Results: U/A dipstick:  CT Renal Stone Study  Result Date: 03/19/2022 CLINICAL DATA:  Abdominal and flank pain. Kidney stone suspected. Symptoms on the right. EXAM: CT ABDOMEN AND PELVIS WITHOUT CONTRAST TECHNIQUE: Multidetector CT imaging of the abdomen and pelvis was performed following the standard protocol without IV contrast. RADIATION DOSE REDUCTION: This exam was performed according to the departmental dose-optimization program which includes automated exposure control, adjustment of the mA and/or kV according to patient size and/or use of iterative reconstruction technique. COMPARISON:  08/14/2021 FINDINGS: Lower chest: Lung bases are clear. Hepatobiliary: Liver parenchyma is normal without contrast. No calcified gallstones. Pancreas: Normal Spleen: Normal Adrenals/Urinary Tract: Adrenal glands are normal. The left kidney is normal. No stone, mass or hydronephrosis. There is swelling of the right kidney with right hydroureteronephrosis. No stone remaining in the right kidney. There is a stone or stones within the distal ureter at the UVJ measuring in total 1 cm in length with a transverse diameter of 3-4 mm. Stomach/Bowel: Stomach and small intestine are normal. Vascular/Lymphatic: Aorta and IVC are normal.  No adenopathy. Reproductive: Normal Other: No free fluid or air. Musculoskeletal: Normal IMPRESSION: Stone or stones within the distal right ureter at the UVJ measuring in total 1 cm in length with a transverse diameter of 3-4 mm. Right hydroureteronephrosis with swelling of the right kidney. No stone remaining in the right kidney. Electronically Signed   By: Nelson Chimes M.D.   On: 03/19/2022 15:39

## 2022-03-22 ENCOUNTER — Ambulatory Visit (INDEPENDENT_AMBULATORY_CARE_PROVIDER_SITE_OTHER): Payer: Medicaid Other | Admitting: Urology

## 2022-03-22 ENCOUNTER — Encounter: Payer: Self-pay | Admitting: Urology

## 2022-03-22 VITALS — BP 143/81 | HR 67 | Ht 71.0 in | Wt 302.0 lb

## 2022-03-22 DIAGNOSIS — N201 Calculus of ureter: Secondary | ICD-10-CM

## 2022-03-22 DIAGNOSIS — N200 Calculus of kidney: Secondary | ICD-10-CM

## 2022-03-22 LAB — URINALYSIS, ROUTINE W REFLEX MICROSCOPIC
Bilirubin, UA: NEGATIVE
Glucose, UA: NEGATIVE
Ketones, UA: NEGATIVE
Leukocytes,UA: NEGATIVE
Nitrite, UA: NEGATIVE
Protein,UA: NEGATIVE
Specific Gravity, UA: 1.025 (ref 1.005–1.030)
Urobilinogen, Ur: 1 mg/dL (ref 0.2–1.0)
pH, UA: 6 (ref 5.0–7.5)

## 2022-03-22 LAB — MICROSCOPIC EXAMINATION
Cast Type: NONE SEEN
Casts: NONE SEEN /lpf
Crystal Type: NONE SEEN
Crystals: NONE SEEN
Renal Epithel, UA: NONE SEEN /hpf
Trichomonas, UA: NONE SEEN
Yeast, UA: NONE SEEN

## 2022-03-22 NOTE — Progress Notes (Signed)
Assessment: 1. Ureteral calculus, right     Plan: I personally reviewed the CT study from 03/19/2022 with results as noted below. Diagnosis and treatment options for a ureteral calculus including spontaneous stone passage and ureteroscopic stone manipulation were discussed with [name] in detail. He has elected to attempt to pass the ureteral calculus. Strain urine, Pain medication as needed.  Rx provided., and Alpha blocker for medical expulsive therapy.  Off label use discussed.  Use and side effects reviewed.  Rx provided. Follow-up in 10 days Return to office or ER for uncontrolled pain, vomiting, fever, or chills.   Chief Complaint:  Chief Complaint  Patient presents with   ureteral calculus    History of Present Illness:  Steven Barrera is a 27 y.o. male who is seen for further evaluation of right-sided flank pain and right ureteral calculus.   He has a history of nephrolithiasis. He was seen in 9/23 for a right UPJ calculus. He presented to the emergency room on 08/14/2021 with right-sided flank pain. CT imaging demonstrated a right renal calculus and a 5 mm calculus at the right UPJ with minimal hydronephrosis. At the time of his visit, he had not had any flank pain since the ER visit.  He was not aware of passing a stone but had not been straining his urine.  No lower urinary tract symptoms.  No dysuria or gross hematuria. No prior history of kidney stones. KUB from 9/23 did not show any obvious calcifications in the area of the right renal shadow or proximal ureter.  He noted onset of some right-sided flank and back pain with radiation into the right groin approximately 4 weeks ago.  His symptoms have been intermittent in nature.  He also noted some dark strong smelling urine.  No fevers or chills.  No gross hematuria or dysuria.  He has not required any pain medication for his symptoms.   He presented to the emergency room on 03/19/2022 with increased right flank  pain.  CT imaging showed a 3-4 mm calculus in the right distal ureter near the UVJ with associated obstruction. Creatinine was slightly increased to 1.25.  Urinalysis showed 11-20 RBCs.  He presents today for follow-up.  He has not had significant pain since his ER visit.  He thinks he may have passed a stone this morning.  He is not currently requiring pain medication.  He is taking tamsulosin.  He has not been straining his urine on a regular basis.  No fevers, chills, nausea, or vomiting.  Portions of the above documentation were copied from a prior visit for review purposes only.   Past Medical History:  Past Medical History:  Diagnosis Date   Childhood asthma    Kidney stone     Past Surgical History:  Past Surgical History:  Procedure Laterality Date   APPENDECTOMY  04/06/2015   LAPAROSCOPIC APPENDECTOMY N/A 04/06/2015   Procedure: APPENDECTOMY LAPAROSCOPIC;  Surgeon: Judeth Horn, MD;  Location: [facility];  Service: General;  Laterality: N/A;   TENDON REPAIR Right 07/31/2014   Procedure: WOUND EXPLORATION WITH EXTENSIVE TENDON REPAIR OF RIGHT INDEX FINGER;  Surgeon: Dayna Barker, MD;  Location: [facility];  Service: Plastics;  Laterality: Right;    Allergies:  Allergies  Allergen Reactions   Shellfish Allergy Swelling    Facial swelling    Family History:  No family history on file.  Social History:  Social History   Tobacco Use   Smoking status: Some Days  Types: Cigars   Smokeless tobacco: Never  Substance Use Topics   Alcohol use: Yes    Comment: 04/06/2015 "I'll drink on holidays/birthdays"   Drug use: No    ROS: Constitutional:  Negative for fever, chills, weight loss CV: Negative for chest pain, previous MI, hypertension Respiratory:  Negative for shortness of breath, wheezing, sleep apnea, frequent cough GI:  Negative for nausea, vomiting, bloody stool, GERD  Physical exam: BP (!) 143/81   Pulse 67   Ht 5\' 11"  (1.803 m)   Wt (!) 302 lb (137 kg)   BMI  42.12 kg/m  GENERAL APPEARANCE:  Well appearing, well developed, well nourished, NAD HEENT:  Atraumatic, normocephalic, oropharynx clear NECK:  Supple without lymphadenopathy or thyromegaly ABDOMEN:  Soft, non-tender, no masses EXTREMITIES:  Moves all extremities well, without clubbing, cyanosis, or edema NEUROLOGIC:  Alert and oriented x 3, normal gait, CN II-XII grossly intact MENTAL STATUS:  appropriate BACK:  Non-tender to palpation, No CVAT SKIN:  Warm, dry, and intact   Results: U/A:  0-5 WBC, 3-10 RBC, few bacteria  CT Renal Stone Study  Result Date: 03/19/2022 CLINICAL DATA:  Abdominal and flank pain. Kidney stone suspected. Symptoms on the right. EXAM: CT ABDOMEN AND PELVIS WITHOUT CONTRAST TECHNIQUE: Multidetector CT imaging of the abdomen and pelvis was performed following the standard protocol without IV contrast. RADIATION DOSE REDUCTION: This exam was performed according to the departmental dose-optimization program which includes automated exposure control, adjustment of the mA and/or kV according to patient size and/or use of iterative reconstruction technique. COMPARISON:  08/14/2021 FINDINGS: Lower chest: Lung bases are clear. Hepatobiliary: Liver parenchyma is normal without contrast. No calcified gallstones. Pancreas: Normal Spleen: Normal Adrenals/Urinary Tract: Adrenal glands are normal. The left kidney is normal. No stone, mass or hydronephrosis. There is swelling of the right kidney with right hydroureteronephrosis. No stone remaining in the right kidney. There is a stone or stones within the distal ureter at the UVJ measuring in total 1 cm in length with a transverse diameter of 3-4 mm. Stomach/Bowel: Stomach and small intestine are normal. Vascular/Lymphatic: Aorta and IVC are normal.  No adenopathy. Reproductive: Normal Other: No free fluid or air. Musculoskeletal: Normal IMPRESSION: Stone or stones within the distal right ureter at the UVJ measuring in total 1 cm in  length with a transverse diameter of 3-4 mm. Right hydroureteronephrosis with swelling of the right kidney. No stone remaining in the right kidney. Electronically Signed   By: Nelson Chimes M.D.   On: 03/19/2022 15:39

## 2022-03-25 ENCOUNTER — Other Ambulatory Visit: Payer: Self-pay

## 2022-03-25 DIAGNOSIS — N200 Calculus of kidney: Secondary | ICD-10-CM | POA: Diagnosis not present

## 2022-03-25 DIAGNOSIS — N201 Calculus of ureter: Secondary | ICD-10-CM

## 2022-03-30 LAB — CALCULI, WITH PHOTOGRAPH (CLINICAL LAB)
Calcium Oxalate Monohydrate: 10 %
Uric Acid Calculi: 90 %
Weight Calculi: 4 mg

## 2022-03-30 LAB — SPECIMEN STATUS REPORT

## 2022-04-01 ENCOUNTER — Encounter: Payer: Self-pay | Admitting: Urology

## 2022-04-12 ENCOUNTER — Encounter: Payer: Self-pay | Admitting: Urology

## 2022-04-12 ENCOUNTER — Ambulatory Visit: Payer: Medicaid Other | Admitting: Urology

## 2022-04-12 VITALS — BP 134/85 | HR 57 | Ht 70.0 in | Wt 302.0 lb

## 2022-04-12 DIAGNOSIS — N200 Calculus of kidney: Secondary | ICD-10-CM | POA: Diagnosis not present

## 2022-04-12 DIAGNOSIS — N469 Male infertility, unspecified: Secondary | ICD-10-CM | POA: Diagnosis not present

## 2022-04-12 DIAGNOSIS — Z87442 Personal history of urinary calculi: Secondary | ICD-10-CM | POA: Diagnosis not present

## 2022-04-12 DIAGNOSIS — N201 Calculus of ureter: Secondary | ICD-10-CM

## 2022-04-12 LAB — URINALYSIS, ROUTINE W REFLEX MICROSCOPIC
Bilirubin, UA: NEGATIVE
Glucose, UA: NEGATIVE
Ketones, UA: NEGATIVE
Leukocytes,UA: NEGATIVE
Nitrite, UA: NEGATIVE
Protein,UA: NEGATIVE
Specific Gravity, UA: 1.03 (ref 1.005–1.030)
Urobilinogen, Ur: 0.2 mg/dL (ref 0.2–1.0)
pH, UA: 6 (ref 5.0–7.5)

## 2022-04-12 LAB — MICROSCOPIC EXAMINATION
Cast Type: NONE SEEN
Casts: NONE SEEN /lpf
Crystal Type: NONE SEEN
Crystals: NONE SEEN
Renal Epithel, UA: NONE SEEN /hpf
Trichomonas, UA: NONE SEEN
Yeast, UA: NONE SEEN

## 2022-04-12 NOTE — Progress Notes (Signed)
Assessment: 1. Ureteral calculus   2. 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 Given the finding of uric acid nephrolithiasis, I have recommended further evaluation with uric acid level and 24-hour urine. Stone prevention discussed. Semen analysis for possible male infertility requested at [facility] Will contact him with results and recommendations for follow-up  Chief Complaint:  Chief Complaint  Patient presents with   Nephrolithiasis   Infertility   History of Present Illness:  Steven Barrera is a 27 y.o. male who is seen for further evaluation of right-sided flank pain and right ureteral calculus.   He has a history of nephrolithiasis. He was seen in 9/23 for a right UPJ calculus. He presented to the emergency room on 08/14/2021 with right-sided flank pain. CT imaging demonstrated a right renal calculus and a 5 mm calculus at the right UPJ with minimal hydronephrosis. At the time of his visit, he had not had any flank pain since the ER visit.  He was not aware of passing a stone but had not been straining his urine.  No lower urinary tract symptoms.  No dysuria or gross hematuria. No prior history of kidney stones. KUB from 9/23 did not show any obvious calcifications in the area of the right renal shadow or proximal ureter.  He noted onset of some right-sided flank and back pain with radiation into the right groin approximately 4 weeks ago.  His symptoms were  intermittent in nature.  He also noted some dark strong smelling urine.  No fevers or chills.  No gross hematuria or dysuria.  He has not required any pain medication for his symptoms.   He presented to the emergency room on 03/19/2022 with increased right flank pain.  CT imaging showed a 3-4 mm calculus in the right distal ureter near the UVJ with associated obstruction. Creatinine was slightly increased to 1.25.  Urinalysis showed 11-20  RBCs. At his visit on 03/22/22, he had not had significant pain since his ER visit.  He passed a stone that morning.  He was not currently requiring pain medication.  He continued tamsulosin.   No fevers, chills, nausea, or vomiting.  Stone analysis demonstrated 90% uric acid, 10% calcium oxalate.  He returns today for scheduled follow-up.  He has not had any additional stone symptoms since his last visit.  He is not aware of passing any additional stones.  No dysuria, gross hematuria, or flank pain. He request evaluation for possible infertility.  He and his wife have been trying to achieve a pregnancy for approximately 1 year.  They do have a child together who is 29 years old.  It took approximately 2 years for them to achieve that pregnancy.  No problems with erections or ejaculation.  They are timing sexual activity with her ovulation.  His wife has not been evaluated recently.  Portions of the above documentation were copied from a prior visit for review purposes only.   Past Medical History:  Past Medical History:  Diagnosis Date   Childhood asthma    Kidney stone     Past Surgical History:  Past Surgical History:  Procedure Laterality Date   APPENDECTOMY  04/06/2015   LAPAROSCOPIC APPENDECTOMY N/A 04/06/2015   Procedure: APPENDECTOMY LAPAROSCOPIC;  Surgeon: Jimmye NormanJames Wyatt, MD;  Location: [facility];  Service: General;  Laterality: N/A;   TENDON REPAIR Right 07/31/2014   Procedure: WOUND EXPLORATION WITH EXTENSIVE TENDON REPAIR OF  RIGHT INDEX FINGER;  Surgeon: Knute Neu, MD;  Location: [facility];  Service: Plastics;  Laterality: Right;    Allergies:  Allergies  Allergen Reactions   Shellfish Allergy Swelling    Facial swelling    Family History:  No family history on file.  Social History:  Social History   Tobacco Use   Smoking status: Some Days    Types: Cigars   Smokeless tobacco: Never  Substance Use Topics   Alcohol use: Yes    Comment: 04/06/2015 "I'll drink on  holidays/birthdays"   Drug use: No    ROS: Constitutional:  Negative for fever, chills, weight loss CV: Negative for chest pain, previous MI, hypertension Respiratory:  Negative for shortness of breath, wheezing, sleep apnea, frequent cough GI:  Negative for nausea, vomiting, bloody stool, GERD  Physical exam: BP 134/85   Pulse (!) 57   Ht 5\' 10"  (1.778 m)   Wt (!) 302 lb (137 kg)   BMI 43.33 kg/m  GENERAL APPEARANCE:  Well appearing, well developed, well nourished, NAD HEENT:  Atraumatic, normocephalic, oropharynx clear NECK:  Supple without lymphadenopathy or thyromegaly ABDOMEN:  Soft, non-tender, no masses EXTREMITIES:  Moves all extremities well, without clubbing, cyanosis, or edema NEUROLOGIC:  Alert and oriented x 3, normal gait, CN II-XII grossly intact MENTAL STATUS:  appropriate BACK:  Non-tender to palpation, No CVAT SKIN:  Warm, dry, and intact GU: Penis:  circumcised Meatus: Normal Scrotum: vas palpated bilaterally, no varicocele Testis: normal without masses bilateral Epididymis: normal  Results: U/A: 0-5 WBC, 0-2 RBC, few bacteria, pH 6.0

## 2022-04-13 LAB — URIC ACID: Uric Acid: 6.6 mg/dL (ref 3.8–8.4)

## 2022-04-22 ENCOUNTER — Encounter: Payer: Self-pay | Admitting: Family Medicine

## 2022-04-22 ENCOUNTER — Ambulatory Visit: Payer: Medicaid Other | Admitting: Family Medicine

## 2022-04-22 VITALS — BP 128/80 | HR 69 | Ht 70.0 in | Wt 310.0 lb

## 2022-04-22 DIAGNOSIS — Z1159 Encounter for screening for other viral diseases: Secondary | ICD-10-CM

## 2022-04-22 DIAGNOSIS — Z0001 Encounter for general adult medical examination with abnormal findings: Secondary | ICD-10-CM | POA: Diagnosis not present

## 2022-04-22 DIAGNOSIS — Z0100 Encounter for examination of eyes and vision without abnormal findings: Secondary | ICD-10-CM

## 2022-04-22 DIAGNOSIS — Z6841 Body Mass Index (BMI) 40.0 and over, adult: Secondary | ICD-10-CM | POA: Diagnosis not present

## 2022-04-22 DIAGNOSIS — Z131 Encounter for screening for diabetes mellitus: Secondary | ICD-10-CM

## 2022-04-22 DIAGNOSIS — Z13220 Encounter for screening for lipoid disorders: Secondary | ICD-10-CM

## 2022-04-22 DIAGNOSIS — Z114 Encounter for screening for human immunodeficiency virus [HIV]: Secondary | ICD-10-CM

## 2022-04-22 DIAGNOSIS — Z1329 Encounter for screening for other suspected endocrine disorder: Secondary | ICD-10-CM

## 2022-04-22 NOTE — Assessment & Plan Note (Signed)
Physical exam done, labs ordered  Updated screening and health maintenance  Exercise and nutrition counseling BMI  44.48 Discussed lifestyle modifications follow diet low in saturated fat, reduce dietary salt intake, avoid fatty foods, maintain an exercise routine 3 to 5 days a week for a minimum total of 150 minutes.  Referral to P.R.E.P

## 2022-04-22 NOTE — Progress Notes (Signed)
Complete physical exam  Patient: Steven Barrera   DOB: December 17, 1995   26 y.o. Male  MRN: [id number]  Subjective:    Chief Complaint  Patient presents with   Establish Care    Steven Barrera is a 27 y.o. male who presents today for a complete physical exam. He reports consuming a general diet. Gym/ health club routine includes cardio and light weights. He generally feels well. He reports sleeping well. He does not have additional problems to discuss today.    Most recent fall risk assessment:    04/22/2022    9:44 AM  Fall Risk   Falls in the past year? 0  Number falls in past yr: 0  Injury with Fall? 0  Risk for fall due to : No Fall Risks  Follow up Falls evaluation completed     Most recent depression screenings:    04/22/2022    9:44 AM  PHQ 2/9 Scores  PHQ - 2 Score 0  PHQ- 9 Score 1    Vision:Not within last year  and Dental: No current dental problems  Patient Care Team: Del Barrera Nip, Tenna Child, FNP as PCP - General (Family Medicine)   Outpatient Medications Prior to Visit  Medication Sig   Blood Glucose Monitoring Suppl (ONETOUCH VERIO) w/Device KIT by Does not apply route.   amoxicillin (AMOXIL) 500 MG capsule Take 2 capsules (1,000 mg total) by mouth 2 (two) times daily.   ketorolac (TORADOL) 10 MG tablet Take 1 tablet (10 mg total) by mouth every 6 (six) hours as needed.   ondansetron (ZOFRAN) 4 MG tablet Take 1 tablet (4 mg total) by mouth every 8 (eight) hours as needed for nausea or vomiting.   tamsulosin (FLOMAX) 0.4 MG CAPS capsule Take 1 capsule (0.4 mg total) by mouth daily.   No facility-administered medications prior to visit.    Review of Systems  Constitutional:  Negative for chills and fever.  HENT:  Negative for ear pain.   Eyes:  Negative for blurred vision.  Respiratory:  Negative for cough and shortness of breath.   Cardiovascular:  Negative for chest pain.  Gastrointestinal:  Negative for abdominal pain, nausea and vomiting.   Genitourinary:  Negative for dysuria.  Musculoskeletal:  Negative for myalgias.  Skin:  Negative for itching and rash.  Neurological:  Negative for dizziness and headaches.  Endo/Heme/Allergies:  Does not bruise/bleed easily.  Psychiatric/Behavioral:  Negative for depression.        Objective:    BP 128/80   Pulse 69   Ht  (1.778 m)   Wt (!) 310 lb (140.6 kg)   SpO2 97%   BMI 44.48 kg/m  BP Readings from Last 3 Encounters:  04/22/22 128/80  04/12/22 134/85  03/22/22 (!) 143/81      Physical Exam Vitals reviewed.  Constitutional:      General: He is not in acute distress.    Appearance: Normal appearance. He is not ill-appearing, toxic-appearing or diaphoretic.  HENT:     Head: Normocephalic.     Right Ear: Tympanic membrane normal.     Left Ear: Tympanic membrane normal.     Nose: Nose normal.     Mouth/Throat:     Mouth: Mucous membranes are moist.     Pharynx: No posterior oropharyngeal erythema.  Eyes:     General:        Right eye: No discharge.        Left eye: No discharge.  Conjunctiva/sclera: Conjunctivae normal.  Cardiovascular:     Rate and Rhythm: Normal rate.     Pulses: Normal pulses.     Heart sounds: Normal heart sounds.  Pulmonary:     Effort: Pulmonary effort is normal. No respiratory distress.     Breath sounds: Normal breath sounds.  Abdominal:     General: Bowel sounds are normal.     Palpations: Abdomen is soft.     Tenderness: There is no abdominal tenderness. There is no right CVA tenderness, left CVA tenderness or guarding.  Musculoskeletal:        General: Normal range of motion.     Cervical back: Normal range of motion.  Skin:    General: Skin is warm and dry.     Capillary Refill: Capillary refill takes less than 2 seconds.  Neurological:     General: No focal deficit present.     Mental Status: He is alert and oriented to person, place, and time.     Coordination: Coordination normal.     Gait: Gait normal.   Psychiatric:        Mood and Affect: Mood normal.        Behavior: Behavior normal.        Thought Content: Thought content normal.        Judgment: Judgment normal.      No results found for any visits on 04/22/22.    Assessment & Plan:    Routine Health Maintenance and Physical Exam  Immunization History  Administered Date(s) Administered   Tdap 05/15/2021    Health Maintenance  Topic Date Due   HEMOGLOBIN A1C  Never done   OPHTHALMOLOGY EXAM  Never done   HIV Screening  Never done   Diabetic kidney evaluation - Urine ACR  Never done   Hepatitis C Screening  Never done   COVID-19 Vaccine (1) 09/09/2022 (Originally 07/[birth year]/1994)   INFLUENZA VACCINE  08/08/2022   Diabetic kidney evaluation - eGFR measurement  03/19/2023   FOOT EXAM  04/22/2023   DTaP/Tdap/Td (2 - Td or Tdap) 05/16/2031   HPV VACCINES  Aged Out    Discussed health benefits of physical activity, and encouraged him to engage in regular exercise appropriate for his age and condition.  Screening for lipid disorders -     CMP14+EGFR -     CBC with Differential/Platelet -     Lipid panel  Encounter for eye exam -     Ambulatory referral to Ophthalmology  Screening for diabetes mellitus -     Hemoglobin A1c  Screening for thyroid disorder -     TSH + free T4  Screening for HIV (human immunodeficiency virus) -     HIV Antibody (routine testing w rflx)  Need for hepatitis C screening test -     Hepatitis C antibody  Encounter for routine adult physical exam with abnormal findings Assessment & Plan: Physical exam done, labs ordered  Updated screening and health maintenance  Exercise and nutrition counseling BMI  44.48 Discussed lifestyle modifications follow diet low in saturated fat, reduce dietary salt intake, avoid fatty foods, maintain an exercise routine 3 to 5 days a week for a minimum total of 150 minutes.  Referral to P.R.E.P   Class 3 severe obesity due to excess calories with body mass  index (BMI) of 40.0 to 44.9 in adult, unspecified whether serious comorbidity present -     Amb Referral To Provider Referral Exercise Program (P.R.E.P)    Return in about  1 year (around 04/22/2023), or if labs abnormal will require follow up, for Annual Physical.     Steven Barrera Nip, FNP

## 2022-04-22 NOTE — Patient Instructions (Addendum)
It was pleasure meeting with you today. Follow up with your primary health provider if any health concerns arises.   

## 2022-05-01 ENCOUNTER — Ambulatory Visit (HOSPITAL_COMMUNITY)
Admission: RE | Admit: 2022-05-01 | Discharge: 2022-05-01 | Disposition: A | Discharge status: Home / Self Care | Payer: Medicaid Other | Source: Ambulatory Visit | Attending: Urology | Admitting: Urology

## 2022-05-01 DIAGNOSIS — N201 Calculus of ureter: Secondary | ICD-10-CM | POA: Diagnosis not present

## 2022-05-01 DIAGNOSIS — R109 Unspecified abdominal pain: Secondary | ICD-10-CM | POA: Diagnosis not present

## 2022-05-01 DIAGNOSIS — N210 Calculus in bladder: Secondary | ICD-10-CM | POA: Diagnosis not present

## 2022-05-06 ENCOUNTER — Encounter: Payer: Self-pay | Admitting: Urology

## 2022-05-16 ENCOUNTER — Telehealth: Payer: Self-pay | Admitting: Urology

## 2022-05-16 NOTE — Telephone Encounter (Signed)
He states he has not received his 24 hr urine collection kit yet, he was wondering if this is something he needs to order himself or if we ordered it to have it delivered to his home. Please advise.

## 2022-05-16 NOTE — Telephone Encounter (Signed)
Patient called in regards to a 24 hour urine test and wants a call back regarding this.  Patients callback #: [phone]

## 2022-05-16 NOTE — Telephone Encounter (Signed)
Pt notified instructions were given at last visit. Pt states he will look over it and call back if he has any questions.

## 2022-05-27 ENCOUNTER — Encounter: Payer: Self-pay | Admitting: Urology

## 2022-05-30 ENCOUNTER — Other Ambulatory Visit: Payer: Self-pay | Admitting: Urology

## 2022-05-30 DIAGNOSIS — N200 Calculus of kidney: Secondary | ICD-10-CM | POA: Diagnosis not present

## 2022-06-10 DIAGNOSIS — F4320 Adjustment disorder, unspecified: Secondary | ICD-10-CM | POA: Diagnosis not present

## 2022-06-15 DIAGNOSIS — G4733 Obstructive sleep apnea (adult) (pediatric): Secondary | ICD-10-CM | POA: Diagnosis not present

## 2022-06-19 ENCOUNTER — Telehealth: Payer: Self-pay | Admitting: Urology

## 2022-06-19 NOTE — Telephone Encounter (Signed)
Patient wife called wanting to know results for at home urine kit that was mailed in 2 weeks ago. Urine smells very strong and leaves odors behind. He will have to urinate and then when he tries he cant and no longer can go.  [facility] - [phone]

## 2022-06-20 ENCOUNTER — Other Ambulatory Visit: Payer: Self-pay | Admitting: Urology

## 2022-06-20 MED ORDER — POTASSIUM CITRATE ER 15 MEQ (1620 MG) PO TBCR: 1.0000 | EXTENDED_RELEASE_TABLET | Freq: Two times a day (BID) | ORAL | 11 refills | Status: DC

## 2022-07-01 DIAGNOSIS — Z903 Acquired absence of stomach [part of]: Secondary | ICD-10-CM | POA: Diagnosis not present

## 2022-07-01 DIAGNOSIS — Z6841 Body Mass Index (BMI) 40.0 and over, adult: Secondary | ICD-10-CM | POA: Diagnosis not present

## 2022-08-13 ENCOUNTER — Encounter: Payer: Self-pay | Admitting: Urology

## 2022-08-13 ENCOUNTER — Ambulatory Visit (INDEPENDENT_AMBULATORY_CARE_PROVIDER_SITE_OTHER): Payer: Medicaid Other | Admitting: Urology

## 2022-08-13 VITALS — BP 136/78 | HR 56 | Ht 71.0 in | Wt 275.0 lb

## 2022-08-13 DIAGNOSIS — N469 Male infertility, unspecified: Secondary | ICD-10-CM

## 2022-08-13 DIAGNOSIS — N200 Calculus of kidney: Secondary | ICD-10-CM | POA: Diagnosis not present

## 2022-08-13 NOTE — Progress Notes (Signed)
Assessment: 1. 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 Continue OTC supplements for abnormal sperm morphology Return to office in 6 months   Chief Complaint:  Chief Complaint  Patient presents with   Nephrolithiasis   History of Present Illness:  Steven Barrera is a 27 y.o. male who is seen for further evaluation of nephrolithiasis.  He has a history of nephrolithiasis. He was seen in 9/23 for a right UPJ calculus. He presented to the emergency room on 08/14/2021 with right-sided flank pain. CT imaging demonstrated a right renal calculus and a 5 mm calculus at the right UPJ with minimal hydronephrosis. At the time of his visit, he had not had any flank pain since the ER visit.  He was not aware of passing a stone but had not been straining his urine.  No lower urinary tract symptoms.  No dysuria or gross hematuria. No prior history of kidney stones. KUB from 9/23 did not show any obvious calcifications in the area of the right renal shadow or proximal ureter.  He noted onset of some right-sided flank and back pain with radiation into the right groin approximately 4 weeks ago.  His symptoms were  intermittent in nature.  He also noted some dark strong smelling urine.  No fevers or chills.  No gross hematuria or dysuria.  He has not required any pain medication for his symptoms.   He presented to the emergency room on 03/19/2022 with increased right flank pain.  CT imaging showed a 3-4 mm calculus in the right distal ureter near the UVJ with associated obstruction. Creatinine was slightly increased to 1.25.  Urinalysis showed 11-20 RBCs. At his visit on 03/22/22, he had not had significant pain since his ER visit.  He passed a stone that morning.  He was not currently requiring pain medication.  He continued tamsulosin.   No fevers, chills, nausea, or vomiting.  Stone analysis  demonstrated 90% uric acid, 10% calcium oxalate. At his visit in April 2024, he had not had any additional stone symptoms since his prior visit.   No dysuria, gross hematuria, or flank pain. CT from 04/27/2022 showed resolution of the distal right ureteral calculus, no renal or ureteral calculi. Uric acid level was normal. 24-hour urine from 05/30/2022 demonstrated low urine volume, borderline elevated urine calcium, elevated urine oxalate, and low urine pH. He was started on potassium citrate.  He requested evaluation for possible infertility.  He and his wife have been trying to achieve a pregnancy for approximately 1 year.  They do have a child together who is 55 years old.  It took approximately 2 years for them to achieve that pregnancy.  No problems with erections or ejaculation.  They are timing sexual activity with her ovulation.  His wife has not been evaluated recently. Semen analysis from 05/20/2022 showed normal concentration and motility with decreased morphology.  He was started on over-the-counter supplements including vitamin C, vitamin D, folic acid, and zinc.  He returns today for follow-up.  He is doing well on the potassium citrate.  No recent stone symptoms.  No side effects from the medication.  No flank pain, dysuria, or gross hematuria. He continues on over-the-counter supplements for his abnormal sperm morphology.  Portions of the above documentation were copied from a prior visit for review purposes only.   Past Medical History:  Past Medical History:  Diagnosis  Date   Childhood asthma    Diabetes mellitus without complication (HCC)    Kidney stone     Past Surgical History:  Past Surgical History:  Procedure Laterality Date   APPENDECTOMY  04/06/2015   LAPAROSCOPIC APPENDECTOMY N/A 04/06/2015   Procedure: APPENDECTOMY LAPAROSCOPIC;  Surgeon: Jimmye Norman, MD;  Location: [facility] OR;  Service: General;  Laterality: N/A;   TENDON REPAIR Right 07/31/2014   Procedure: WOUND  EXPLORATION WITH EXTENSIVE TENDON REPAIR OF RIGHT INDEX FINGER;  Surgeon: Knute Neu, MD;  Location: [facility] OR;  Service: Plastics;  Laterality: Right;    Allergies:  Allergies  Allergen Reactions   Shellfish Allergy Swelling    Facial swelling    Family History:  Family History  Problem Relation Age of Onset   Kidney failure Maternal Grandmother    Kidney failure Other     Social History:  Social History   Tobacco Use   Smoking status: Some Days    Types: Cigars   Smokeless tobacco: Never  Substance Use Topics   Alcohol use: Yes    Comment: 04/06/2015 "I'll drink on holidays/birthdays"   Drug use: No    ROS: Constitutional:  Negative for fever, chills, weight loss CV: Negative for chest pain, previous MI, hypertension Respiratory:  Negative for shortness of breath, wheezing, sleep apnea, frequent cough GI:  Negative for nausea, vomiting, bloody stool, GERD  Physical exam: BP 136/78   Pulse (!) 56   Ht 5\' 11"  (1.803 m)   Wt 275 lb (124.7 kg)   BMI 38.35 kg/m  GENERAL APPEARANCE:  Well appearing, well developed, well nourished, NAD HEENT:  Atraumatic, normocephalic, oropharynx clear NECK:  Supple without lymphadenopathy or thyromegaly ABDOMEN:  Soft, non-tender, no masses EXTREMITIES:  Moves all extremities well, without clubbing, cyanosis, or edema NEUROLOGIC:  Alert and oriented x 3, normal gait, CN II-XII grossly intact MENTAL STATUS:  appropriate BACK:  Non-tender to palpation, No CVAT SKIN:  Warm, dry, and intact  Results: U/A: pH 6

## 2022-08-15 ENCOUNTER — Ambulatory Visit: Payer: Medicaid Other | Admitting: Urology

## 2022-10-23 LAB — LITHOLINK 24HR URINE PANEL
Ammonium, Urine: 48 mmol/(24.h) (ref 15–60)
Calcium Oxalate Saturation: 9.89 (ref 6.00–10.00)
Calcium Phosphate Saturation: 0.56 (ref 0.50–2.00)
Calcium, Urine: 251 mg/(24.h) — ABNORMAL HIGH
Calcium/Creatinine Ratio: 76 mg/g{creat} (ref 34–196)
Calcium/Kg Body Weight: 1.9 mg/kg/d
Chloride, Urine: 317 mmol/(24.h) — ABNORMAL HIGH (ref 70–250)
Citrate, Urine: 708 mg/(24.h)
Creatinine, Urine: 3302 mg/(24.h)
Creatinine/Kg Body Weight: 25.2 mg/kg/d — ABNORMAL HIGH (ref 11.9–24.4)
Cystine, Urine, Qualitative: NEGATIVE
Magnesium, Urine: 234 mg/(24.h) — ABNORMAL HIGH (ref 30–120)
Oxalate, Urine: 54 mg/(24.h) — ABNORMAL HIGH (ref 20–40)
Phosphorus, Urine: 1009 mg/(24.h) (ref 600–1200)
Potassium, Urine: 100 mmol/(24.h) (ref 20–100)
Protein Catabolic Rate: 1.3 g/kg/d (ref 0.8–1.4)
Sodium, Urine: 251 mmol/(24.h) — ABNORMAL HIGH (ref 50–150)
Sulfate, Urine: 81 meq/(24.h) — ABNORMAL HIGH (ref 20–80)
Urea Nitrogen, Urine: 23.62 g/(24.h) — ABNORMAL HIGH (ref 6.00–14.00)
Uric Acid Saturation: 1.81 — ABNORMAL HIGH
Uric Acid, Urine: 546 mg/(24.h)
Urine Volume (Preserved): 1390 mL/(24.h) (ref 500–4000)
pH, 24 hr, Urine: 5.475 — ABNORMAL LOW (ref 5.800–6.200)

## 2022-12-12 DIAGNOSIS — G4733 Obstructive sleep apnea (adult) (pediatric): Secondary | ICD-10-CM | POA: Diagnosis not present

## 2022-12-23 DIAGNOSIS — F4320 Adjustment disorder, unspecified: Secondary | ICD-10-CM | POA: Diagnosis not present

## 2023-02-26 DIAGNOSIS — Z9884 Bariatric surgery status: Secondary | ICD-10-CM | POA: Diagnosis not present

## 2023-03-16 ENCOUNTER — Ambulatory Visit
Admission: EM | Admit: 2023-03-16 | Discharge: 2023-03-16 | Disposition: A | Discharge status: Home / Self Care | Attending: Family Medicine | Admitting: Family Medicine

## 2023-03-16 DIAGNOSIS — M5432 Sciatica, left side: Secondary | ICD-10-CM | POA: Diagnosis not present

## 2023-03-16 MED ORDER — CYCLOBENZAPRINE HCL 10 MG PO TABS: 10.0000 mg | ORAL_TABLET | Freq: Three times a day (TID) | ORAL | 0 refills | Status: DC | PRN

## 2023-03-16 NOTE — ED Triage Notes (Signed)
 Pt reports sharp pains in lower back into the leg, states he noticed the pain when he woke up and it hasn't gotten any better.

## 2023-03-16 NOTE — ED Provider Notes (Signed)
 [facility]    CSN: [id number] Arrival date & time: 03/16/23  1540      History   Chief Complaint No chief complaint on file.   HPI Steven Barrera is a 28 y.o. male.   Patient presenting today with about a week of waxing and waning left low back pain into the buttock shooting down the leg at times.  States he was doing some heavy lifting prior to onset of symptoms.  Denies significant numbness, weakness, bowel or bladder incontinence, fevers, chills, urinary symptoms.  So far trying ibuprofen with mild temporary benefit.    Past Medical History:  Diagnosis Date   Childhood asthma    Diabetes mellitus without complication (HCC)    Kidney stone     Patient Active Problem List   Diagnosis Date Noted   Encounter for routine adult physical exam with abnormal findings 04/22/2022   Nephrolithiasis 09/07/2021   Severe obstructive sleep apnea 01/10/2021   Hypertension complicating diabetes (HCC) 01/04/2020   Morbidly obese (HCC) 01/04/2020   Acute appendicitis 04/06/2015    Past Surgical History:  Procedure Laterality Date   APPENDECTOMY  04/06/2015   LAPAROSCOPIC APPENDECTOMY N/A 04/06/2015   Procedure: APPENDECTOMY LAPAROSCOPIC;  Surgeon: Jimmye Norman, MD;  Location: [facility] OR;  Service: General;  Laterality: N/A;   TENDON REPAIR Right 07/31/2014   Procedure: WOUND EXPLORATION WITH EXTENSIVE TENDON REPAIR OF RIGHT INDEX FINGER;  Surgeon: Knute Neu, MD;  Location: [facility] OR;  Service: Plastics;  Laterality: Right;       Home Medications    Prior to Admission medications   Medication Sig Start Date End Date Taking? Authorizing Provider  cyclobenzaprine (FLEXERIL) 10 MG tablet Take 1 tablet (10 mg total) by mouth 3 (three) times daily as needed for muscle spasms. Do not drink alcohol or drive while taking this medication.  May cause drowsiness. 03/16/23  Yes Particia Nearing, PA-C  Blood Glucose Monitoring Suppl (ONETOUCH VERIO) w/Device KIT by Does not apply  route. 12/26/19   [provider]  Potassium Citrate 15 MEQ (1620 MG) TBCR Take 1 tablet by mouth 2 (two) times daily. 06/20/22   Stoneking, Danford Bad., MD  tamsulosin (FLOMAX) 0.4 MG CAPS capsule Take 1 capsule (0.4 mg total) by mouth daily. 03/19/22   Lonell Grandchild, MD    Family History Family History  Problem Relation Age of Onset   Kidney failure Maternal Grandmother    Kidney failure Other     Social History Social History   Tobacco Use   Smoking status: Some Days    Types: Cigars   Smokeless tobacco: Never  Substance Use Topics   Alcohol use: Yes    Comment: 04/06/2015 "I'll drink on holidays/birthdays"   Drug use: No     Allergies   Shellfish allergy   Review of Systems Review of Systems PER HPI  Physical Exam Triage Vital Signs ED Triage Vitals  Encounter Vitals Group     BP 03/16/23 1544 (!) 145/73     Systolic BP Percentile --      Diastolic BP Percentile --      Pulse Rate 03/16/23 1544 84     Resp 03/16/23 1544 16     Temp 03/16/23 1544 98.3 F (36.8 C)     Temp Source 03/16/23 1544 Oral     SpO2 03/16/23 1544 95 %     Weight --      Height --      Head Circumference --  Peak Flow --      Pain Score 03/16/23 1547 6     Pain Loc --      Pain Education --      Exclude from Growth Chart --    No data found.  Updated Vital Signs BP (!) 145/73 (BP Location: Right Arm)   Pulse 84   Temp 98.3 F (36.8 C) (Oral)   Resp 16   SpO2 95%   Visual Acuity Right Eye Distance:   Left Eye Distance:   Bilateral Distance:    Right Eye Near:   Left Eye Near:    Bilateral Near:     Physical Exam Vitals and nursing note reviewed.  Constitutional:      Appearance: Normal appearance.  HENT:     Head: Atraumatic.  Eyes:     Extraocular Movements: Extraocular movements intact.     Conjunctiva/sclera: Conjunctivae normal.  Cardiovascular:     Rate and Rhythm: Normal rate.  Pulmonary:     Effort: Pulmonary effort is normal.   Musculoskeletal:        General: Tenderness present. No swelling or deformity. Normal range of motion.     Cervical back: Normal range of motion and neck supple.     Comments: Tender to palpation left lumbar musculature and left buttock.  Negative straight leg raise bilateral lower extremities.  No midline spinal tenderness to palpation diffusely  Skin:    General: Skin is warm and dry.     Findings: No bruising or erythema.  Neurological:     General: No focal deficit present.     Mental Status: He is oriented to person, place, and time.     Motor: No weakness.     Gait: Gait normal.     Comments: Left lower extremity neurovascularly intact  Psychiatric:        Mood and Affect: Mood normal.        Thought Content: Thought content normal.        Judgment: Judgment normal.      UC Treatments / Results  Labs (all labs ordered are listed, but only abnormal results are displayed) Labs Reviewed - No data to display  EKG   Radiology No results found.  Procedures Procedures (including critical care time)  Medications Ordered in UC Medications - No data to display  Initial Impression / Assessment and Plan / UC Course  I have reviewed the triage vital signs and the nursing notes.  Pertinent labs & imaging results that were available during my care of the patient were reviewed by me and considered in my medical decision making (see chart for details).     Exam reassuring today with no red flag findings.  Consistent with sciatica/piriformis syndrome type presentation.  Continue ibuprofen, Tylenol, and add Flexeril as needed.  Discussed abortive over-the-counter medications and home care.  Return for worsening symptoms.  Final Clinical Impressions(s) / UC Diagnoses   Final diagnoses:  Left sided sciatica   Discharge Instructions   None    ED Prescriptions     Medication Sig Dispense Auth. Provider   cyclobenzaprine (FLEXERIL) 10 MG tablet Take 1 tablet (10 mg total)  by mouth 3 (three) times daily as needed for muscle spasms. Do not drink alcohol or drive while taking this medication.  May cause drowsiness. 15 tablet Particia Nearing, [place]      PDMP not reviewed this encounter.   Particia Nearing, [place] 03/16/23 1558

## 2023-03-24 DIAGNOSIS — Z713 Dietary counseling and surveillance: Secondary | ICD-10-CM | POA: Diagnosis not present

## 2023-03-24 DIAGNOSIS — Z903 Acquired absence of stomach [part of]: Secondary | ICD-10-CM | POA: Diagnosis not present

## 2023-05-14 DIAGNOSIS — G4733 Obstructive sleep apnea (adult) (pediatric): Secondary | ICD-10-CM | POA: Diagnosis not present

## 2023-08-15 ENCOUNTER — Encounter (HOSPITAL_COMMUNITY): Payer: Self-pay | Admitting: Emergency Medicine

## 2023-08-15 ENCOUNTER — Emergency Department (HOSPITAL_COMMUNITY)
Admission: EM | Admit: 2023-08-15 | Discharge: 2023-08-15 | Disposition: A | Discharge status: Home / Self Care | Attending: Emergency Medicine | Admitting: Emergency Medicine

## 2023-08-15 ENCOUNTER — Other Ambulatory Visit: Payer: Self-pay

## 2023-08-15 DIAGNOSIS — J45909 Unspecified asthma, uncomplicated: Secondary | ICD-10-CM | POA: Insufficient documentation

## 2023-08-15 DIAGNOSIS — E119 Type 2 diabetes mellitus without complications: Secondary | ICD-10-CM | POA: Diagnosis not present

## 2023-08-15 DIAGNOSIS — R002 Palpitations: Secondary | ICD-10-CM | POA: Diagnosis not present

## 2023-08-15 MED ORDER — HYDROXYZINE HCL 25 MG PO TABS
50.0000 mg | ORAL_TABLET | Freq: Once | ORAL | Status: AC
  Administered 2023-08-15: 50 mg via ORAL
  Filled 2023-08-15: qty 2

## 2023-08-15 NOTE — Discharge Instructions (Signed)
 You were evaluated in the emergency room after taking preworkout.  Your EKG did not show any significant findings.  Your symptoms are within the expected adverse effects of preworkout.  I would recommend reducing your dose or discontinuing use altogether.  If you experience any new or worsening symptoms including chest pain, shortness of breath, dizziness, nausea please return to the emergency room.

## 2023-08-15 NOTE — ED Provider Notes (Signed)
 [place] [facility] AT [facility] Provider Note   CSN: [id number] Arrival date & time: 08/15/23  0901     Patient presents with: Palpitations   Steven Barrera is a 28 y.o. male with history of diabetes presents with complaints of feeling jittery and palpitations after he took 2 scoops of preworkout last night.  He denies any chest pain, shortness of breath, dizziness, nausea.  He has no cardiac history.  His symptoms are not exertional.  States he has been unable to sleep.    Palpitations     Past Medical History:  Diagnosis Date   Childhood asthma    Diabetes mellitus without complication (HCC)    Kidney stone    Past Surgical History:  Procedure Laterality Date   APPENDECTOMY  04/06/2015   LAPAROSCOPIC APPENDECTOMY N/A 04/06/2015   Procedure: APPENDECTOMY LAPAROSCOPIC;  Surgeon: Lynwood Pina, MD;  Location: [facility] OR;  Service: General;  Laterality: N/A;   TENDON REPAIR Right 07/31/2014   Procedure: WOUND EXPLORATION WITH EXTENSIVE TENDON REPAIR OF RIGHT INDEX FINGER;  Surgeon: Balinda Rogue, MD;  Location: [facility] OR;  Service: Plastics;  Laterality: Right;     Prior to Admission medications   Medication Sig Start Date End Date Taking? Authorizing Provider  cyclobenzaprine  (FLEXERIL ) 10 MG tablet Take 1 tablet (10 mg total) by mouth 3 (three) times daily as needed for muscle spasms. Do not drink alcohol or drive while taking this medication.  May cause drowsiness. 03/16/23   Stuart Vernell Norris, PA-C  Potassium Citrate  15 MEQ (1620 MG) TBCR Take 1 tablet by mouth 2 (two) times daily. 06/20/22   Stoneking, Adine JINNY., MD  tamsulosin  (FLOMAX ) 0.4 MG CAPS capsule Take 1 capsule (0.4 mg total) by mouth daily. 03/19/22   Francesca Elsie CROME, MD    Allergies: Shellfish allergy    Review of Systems  Cardiovascular:  Positive for palpitations.    Updated Vital Signs BP (!) 153/84   Pulse 78   Temp 98 F (36.7 C) (Oral)   Resp (!) 25   Ht 5' 10 (1.778 m)    Wt 113.4 kg   SpO2 100%   BMI 35.87 kg/m   Physical Exam Vitals and nursing note reviewed.  Constitutional:      General: He is not in acute distress.    Appearance: He is well-developed.  HENT:     Head: Normocephalic and atraumatic.  Eyes:     Conjunctiva/sclera: Conjunctivae normal.  Cardiovascular:     Rate and Rhythm: Normal rate and regular rhythm.     Heart sounds: No murmur heard. Pulmonary:     Effort: Pulmonary effort is normal. No respiratory distress.     Breath sounds: Normal breath sounds.  Abdominal:     Palpations: Abdomen is soft.     Tenderness: There is no abdominal tenderness.  Musculoskeletal:        General: No swelling.     Cervical back: Neck supple.  Skin:    General: Skin is warm and dry.     Capillary Refill: Capillary refill takes less than 2 seconds.  Neurological:     Mental Status: He is alert.  Psychiatric:        Mood and Affect: Mood normal.     (all labs ordered are listed, but only abnormal results are displayed) Labs Reviewed - No data to display  EKG: None  Radiology: No results found.   Procedures   Medications Ordered in the ED  hydrOXYzine  (ATARAX ) tablet  50 mg (has no administration in time range)                                    Medical Decision Making  This patient presents to the ED with chief complaint(s) of palpitations. .  The complaint involves an extensive differential diagnosis and also carries with it a high risk of complications and morbidity.   Pertinent past medical history as listed in HPI  The differential diagnosis includes  ACS, PE, arrhythmia, supplement adverse effect Additional history obtained: Records reviewed Care Everywhere/External Records  Assessment and management:   Patient presents with complaints of feeling jittery and palpitations that started after taking 2 scoops of preworkout last night.  Has been unable to sleep.  EKG demonstrates sinus rhythm without ischemic changes.  He  is not tachycardic here in the ED.  He has no cardiac history.  He is PERC negative.  He is without any chest pain or shortness of breath.  Overall do not suspect ACS, PE, aortic dissection.  Upon examination of his preworkout he did not exceed the recommended dose and therefore do not feel that consulting poison control is indicated.  His symptoms are certainly within the expected adverse effects of preworkout.  Will provide a dose of hydroxyzine  for symptomatic relief and discharge home.  Recommended reducing his dose of preworkout.  Strict return precautions provided.  Patient is in agreement with plan.  Independent ECG interpretation:  Sinus rhythm without ischemic changes  Independent labs interpretation:  The following labs were independently interpreted:  none  Independent visualization and interpretation of imaging: I independently visualized the following imaging with scope of interpretation limited to determining acute life threatening conditions related to emergency care: none    Consultations obtained:   none  Disposition:   Patient will be discharged home. The patient has been appropriately medically screened and/or stabilized in the ED. I have low suspicion for any other emergent medical condition which would require further screening, evaluation or treatment in the ED or require inpatient management. At time of discharge the patient is hemodynamically stable and in no acute distress. I have discussed work-up results and diagnosis with patient and answered all questions. Patient is agreeable with discharge plan. We discussed strict return precautions for returning to the emergency department and they verbalized understanding.     Social Determinants of Health:   none  This note was dictated with voice recognition software.  Despite best efforts at proofreading, errors may have occurred which can change the documentation meaning.       Final diagnoses:  Palpitations    ED  Discharge Orders     None          Donnajean Lynwood VEAR DEVONNA 08/15/23 1005    Suzette Pac, MD 08/16/23 [id number]

## 2023-08-15 NOTE — ED Triage Notes (Signed)
 Pt ambulatory to triage. Pt endorses heart palpitations starting around 0000 last night. Pt states that he feels like his heart is racing. Pt states he took double the dose of pre-workout last night at 2300 and hasn't felt right since. Endorses SOB, weakness. Denies dizziness, N/V, and chest pain.   No cardiac hx.

## 2023-08-29 DIAGNOSIS — Z9884 Bariatric surgery status: Secondary | ICD-10-CM | POA: Diagnosis not present

## 2023-09-05 ENCOUNTER — Ambulatory Visit: Payer: Self-pay

## 2023-09-05 NOTE — Telephone Encounter (Signed)
 Appt made.

## 2023-09-05 NOTE — Telephone Encounter (Signed)
 FYI Only or Action Required?: FYI only for provider.  Patient was last seen in primary care on 04/22/2022 by Terry Wilhelmena Lloyd Hilario, FNP.  Called Nurse Triage reporting Shoulder Pain.  Symptoms began 1-2 months ago.  Interventions attempted: Nothing.  Symptoms are: stable.  Triage Disposition: See PCP Within 2 Weeks  Patient/caregiver understands and will follow disposition?:   Copied from CRM #[id number]. Topic: Clinical - Red Word Triage >> Sep 05, 2023  1:04 PM Winona R wrote: Right shoulder pain since yesterday. Reason for Disposition  [1] MILD pain (e.g., does not interfere with normal activities) AND [2] present > 7 days  Answer Assessment - Initial Assessment Questions Taken nothing.   1. ONSET: When did the pain start?     1-2 months ago  2. LOCATION: Where is the pain located?     Front deltoid, radiates to back of scapula   3. PAIN: How bad is the pain? (Scale 1-10; or mild, moderate, severe)     7/10 when its acting up  4. WORK OR EXERCISE: Has there been any recent work or exercise that involved this part of the body?     May have working out  5. CAUSE: What do you think is causing the shoulder pain?     Possibly from working out  6. OTHER SYMPTOMS: Do you have any other symptoms? (e.g., neck pain, swelling, rash, fever, numbness, weakness)     No  Protocols used: Shoulder Pain-A-AH

## 2023-09-19 ENCOUNTER — Ambulatory Visit: Payer: Self-pay | Admitting: Nurse Practitioner

## 2023-09-19 DIAGNOSIS — M7581 Other shoulder lesions, right shoulder: Secondary | ICD-10-CM | POA: Diagnosis not present

## 2023-10-17 ENCOUNTER — Telehealth: Payer: Self-pay | Admitting: Urology

## 2023-10-17 NOTE — Telephone Encounter (Signed)
 Patient called in today to have Potassium Citrate  15 MEQ (1620 MG) TBCR medication refilled.  Reviewed pharmacy with patient. [facility] in [place]  The medication refill sent as requested per patient.Pt states he is completely out/  Has the patient been seen within the last year? No Does the patient need an appointment?   Scheduled Nov 25,2025

## 2023-10-19 ENCOUNTER — Other Ambulatory Visit: Payer: Self-pay | Admitting: Urology

## 2023-10-19 MED ORDER — POTASSIUM CITRATE ER 15 MEQ (1620 MG) PO TBCR: 1.0000 | EXTENDED_RELEASE_TABLET | Freq: Two times a day (BID) | ORAL | 3 refills | Status: DC

## 2023-12-02 ENCOUNTER — Ambulatory Visit: Admitting: Urology

## 2023-12-02 NOTE — Progress Notes (Deleted)
 Assessment: 1. 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 Continue OTC supplements for abnormal sperm morphology Return to office in 6 months   Chief Complaint:  No chief complaint on file.  History of Present Illness:  Steven Barrera is a 28 y.o. male who is seen for further evaluation of nephrolithiasis and male infertility.  He has a history of nephrolithiasis. He was seen in 9/23 for a right UPJ calculus. He presented to the emergency room on 08/14/2021 with right-sided flank pain. CT imaging demonstrated a right renal calculus and a 5 mm calculus at the right UPJ with minimal hydronephrosis. At the time of his visit, he had not had any flank pain since the ER visit.  He was not aware of passing a stone but had not been straining his urine.  No lower urinary tract symptoms.  No dysuria or gross hematuria. No prior history of kidney stones. KUB from 9/23 did not show any obvious calcifications in the area of the right renal shadow or proximal ureter.  He noted onset of some right-sided flank and back pain with radiation into the right groin approximately 4 weeks ago.  His symptoms were  intermittent in nature.  He also noted some dark strong smelling urine.  No fevers or chills.  No gross hematuria or dysuria.  He has not required any pain medication for his symptoms.   He presented to the emergency room on 03/19/2022 with increased right flank pain.  CT imaging showed a 3-4 mm calculus in the right distal ureter near the UVJ with associated obstruction. Creatinine was slightly increased to 1.25.  Urinalysis showed 11-20 RBCs. At his visit on 03/22/22, he had not had significant pain since his ER visit.  He passed a stone that morning.  He was not currently requiring pain medication.  He continued tamsulosin .   No fevers, chills, nausea, or vomiting.  Stone analysis  demonstrated 90% uric acid, 10% calcium oxalate. At his visit in April 2024, he had not had any additional stone symptoms since his prior visit.   No dysuria, gross hematuria, or flank pain. CT from 04/27/2022 showed resolution of the distal right ureteral calculus, no renal or ureteral calculi. Uric acid level was normal. 24-hour urine from 05/30/2022 demonstrated low urine volume, borderline elevated urine calcium, elevated urine oxalate, and low urine pH. He was started on potassium citrate .  He requested evaluation for possible infertility.  He and his wife have been trying to achieve a pregnancy for approximately 1 year.  They do have a child together who is 44 years old.  It took approximately 2 years for them to achieve that pregnancy.  No problems with erections or ejaculation.  They are timing sexual activity with her ovulation.  His wife has not been evaluated recently. Semen analysis from 05/20/2022 showed normal concentration and motility with decreased morphology.  He was started on over-the-counter supplements including vitamin C, vitamin D, folic acid, and zinc.  At his visit in August 2024, he was doing well on the potassium citrate .  No recent stone symptoms.  No side effects from the medication.  No flank pain, dysuria, or gross hematuria. He continued on over-the-counter supplements for his abnormal sperm morphology.  Portions of the above documentation were copied from a prior visit for review purposes only.   Past Medical History:  Past Medical History:  Diagnosis Date  Childhood asthma    Diabetes mellitus without complication (HCC)    Kidney stone     Past Surgical History:  Past Surgical History:  Procedure Laterality Date   APPENDECTOMY  04/06/2015   LAPAROSCOPIC APPENDECTOMY N/A 04/06/2015   Procedure: APPENDECTOMY LAPAROSCOPIC;  Surgeon: Lynwood Pina, MD;  Location: [facility] OR;  Service: General;  Laterality: N/A;   TENDON REPAIR Right 07/31/2014   Procedure: WOUND  EXPLORATION WITH EXTENSIVE TENDON REPAIR OF RIGHT INDEX FINGER;  Surgeon: Balinda Rogue, MD;  Location: [facility] OR;  Service: Plastics;  Laterality: Right;    Allergies:  Allergies  Allergen Reactions   Shellfish Allergy Swelling    Facial swelling    Family History:  Family History  Problem Relation Age of Onset   Kidney failure Maternal Grandmother    Kidney failure Other     Social History:  Social History   Tobacco Use   Smoking status: Some Days    Types: Cigars, E-cigarettes   Smokeless tobacco: Never  Vaping Use   Vaping status: Some Days  Substance Use Topics   Alcohol use: Yes    Comment: 04/06/2015 I'll drink on holidays/birthdays   Drug use: No    ROS: Constitutional:  Negative for fever, chills, weight loss CV: Negative for chest pain, previous MI, hypertension Respiratory:  Negative for shortness of breath, wheezing, sleep apnea, frequent cough GI:  Negative for nausea, vomiting, bloody stool, GERD  Physical exam: There were no vitals taken for this visit. GENERAL APPEARANCE:  Well appearing, well developed, well nourished, NAD HEENT:  Atraumatic, normocephalic, oropharynx clear NECK:  Supple without lymphadenopathy or thyromegaly ABDOMEN:  Soft, non-tender, no masses EXTREMITIES:  Moves all extremities well, without clubbing, cyanosis, or edema NEUROLOGIC:  Alert and oriented x 3, normal gait, CN II-XII grossly intact MENTAL STATUS:  appropriate BACK:  Non-tender to palpation, No CVAT SKIN:  Warm, dry, and intact  Results: U/A:

## 2024-01-12 ENCOUNTER — Telehealth: Payer: Self-pay | Admitting: Family Medicine

## 2024-01-12 NOTE — Telephone Encounter (Signed)
"  Appt added to wait list   "

## 2024-01-12 NOTE — Telephone Encounter (Signed)
 Copied from CRM [id number]. Topic: Referral - Request for Referral >> Jan 12, 2024  2:19 PM Antony RAMAN wrote: Did the patient discuss referral with their provider in the last year? No (If No - schedule appointment) (If Yes - send message)  Appointment offered? Yes  Type of order/referral and detailed reason for visit: mri hurt shoulder while working out  Preference of office, provider, location: none  If referral order, have you been seen by this specialty before? No (If Yes, this issue or another issue? When? Where?  Can we respond through MyChart? Yes

## 2024-01-21 ENCOUNTER — Emergency Department (HOSPITAL_COMMUNITY)

## 2024-01-21 ENCOUNTER — Other Ambulatory Visit: Payer: Self-pay

## 2024-01-21 ENCOUNTER — Encounter (HOSPITAL_COMMUNITY): Payer: Self-pay

## 2024-01-21 ENCOUNTER — Ambulatory Visit

## 2024-01-21 ENCOUNTER — Emergency Department (HOSPITAL_COMMUNITY)
Admission: EM | Admit: 2024-01-21 | Discharge: 2024-01-21 | Disposition: A | Discharge status: Home / Self Care | Attending: Emergency Medicine | Admitting: Emergency Medicine

## 2024-01-21 VITALS — BP 127/75 | HR 69 | Resp 16 | Ht 70.0 in | Wt 229.0 lb

## 2024-01-21 DIAGNOSIS — R799 Abnormal finding of blood chemistry, unspecified: Secondary | ICD-10-CM | POA: Insufficient documentation

## 2024-01-21 DIAGNOSIS — M25511 Pain in right shoulder: Secondary | ICD-10-CM | POA: Diagnosis present

## 2024-01-21 DIAGNOSIS — N200 Calculus of kidney: Secondary | ICD-10-CM

## 2024-01-21 DIAGNOSIS — G8929 Other chronic pain: Secondary | ICD-10-CM | POA: Diagnosis not present

## 2024-01-21 LAB — CBG MONITORING, ED: Glucose-Capillary: 79 mg/dL (ref 70–99)

## 2024-01-21 MED ORDER — PREDNISONE 10 MG PO TABS: 40.0000 mg | ORAL_TABLET | Freq: Every day | ORAL | 0 refills | Status: AC

## 2024-01-21 MED ORDER — POTASSIUM CITRATE ER 15 MEQ (1620 MG) PO TBCR: 1.0000 | EXTENDED_RELEASE_TABLET | Freq: Two times a day (BID) | ORAL | 2 refills | Status: AC

## 2024-01-21 NOTE — ED Notes (Signed)
 See triage notes. Rom wnl. Nad.

## 2024-01-21 NOTE — Discharge Instructions (Signed)
 Please call to make an appointment with orthopedics.  Please take the prescribed medication as directed.  Return to emergency department immediately for any new or worsening symptoms.

## 2024-01-21 NOTE — ED Provider Notes (Signed)
 " [place] EMERGENCY DEPARTMENT AT [facility] Provider Note   CSN: [id number] Arrival date & time: 01/21/24  9050     Patient presents with: Shoulder Pain   Steven Barrera is a 29 y.o. male.   Patient is a 29 year old male who presents emergency department the chief complaint of right shoulder pain which has been ongoing since August.  Patient notes that he got initial joint injection at urgent care and states that this did help for a while.  He notes that approxi-1 month ago he did get recurrence of symptoms.  He did follow-up with his primary care doctor this morning who recommended he come to the emergency department for further evaluation.  He denies any associated swelling, redness or edema to the shoulder.  He has had no numbness or paresthesias distally.  He has had no repeat injuries to his shoulder.  He notes that the pain started after doing shoulder presses.   Shoulder Pain      Prior to Admission medications  Medication Sig Start Date End Date Taking? Authorizing Provider  Potassium Citrate  15 MEQ (1620 MG) TBCR Take 1 tablet by mouth 2 (two) times daily. 01/21/24   Bevely Doffing, FNP    Allergies: Shellfish allergy    Review of Systems  Musculoskeletal:        Right shoulder pain  All other systems reviewed and are negative.   Updated Vital Signs BP 112/63 (BP Location: Left Arm)   Pulse 61   Temp 97.8 F (36.6 C) (Temporal)   Resp 16   Ht 5' 10 (1.778 m)   Wt 103.9 kg   SpO2 100%   BMI 32.86 kg/m   Physical Exam Vitals and nursing note reviewed.  Constitutional:      Appearance: Normal appearance.  HENT:     Head: Normocephalic and atraumatic.     Mouth/Throat:     Mouth: Mucous membranes are moist.  Eyes:     Extraocular Movements: Extraocular movements intact.     Conjunctiva/sclera: Conjunctivae normal.     Pupils: Pupils are equal, round, and reactive to light.  Cardiovascular:     Rate and Rhythm: Normal rate and regular  rhythm.     Pulses: Normal pulses.     Heart sounds: Normal heart sounds.  Pulmonary:     Effort: Pulmonary effort is normal.     Breath sounds: Normal breath sounds.  Musculoskeletal:        General: Normal range of motion.     Cervical back: Normal range of motion and neck supple.     Comments: Mild tenderness palpation over the right shoulder and scapula, nontender palpation of remainder of right upper extremity, radial pulse 2+ distally, full range of motion noted throughout, cap refill less than 2 seconds distally, sensation intact distally, radial, ulnar, median, axillary nerve function intact distally, no edema or erythema, no obvious deformity or bruising, no skin breakdown or ulceration  Skin:    General: Skin is warm and dry.  Neurological:     General: No focal deficit present.     Mental Status: He is alert and oriented to person, place, and time. Mental status is at baseline.  Psychiatric:        Mood and Affect: Mood normal.        Behavior: Behavior normal.        Thought Content: Thought content normal.        Judgment: Judgment normal.     (all labs  ordered are listed, but only abnormal results are displayed) Labs Reviewed  CBG MONITORING, ED    EKG: None  Radiology: No results found.   Procedures   Medications Ordered in the ED - No data to display                                  Medical Decision Making Patient is doing well at this time and is stable for discharge home.  Discussed with patient that we will treat him with a course of steroids as this has helped previously.  Low suspicion for underlying etiology such as septic joint at this point.  He is neurovascularly intact distally.  I do not suspect that further emergent workup is warranted at this time.  Discussed the importance of close follow-up with orthopedics for continued evaluation of the right shoulder pain.  Discussed with patient we cannot rule out underlying rotator cuff injury at this  time.  Strict turn precautions were discussed for any new or worsening symptoms.  Patient voiced understanding and had no additional questions.  Will avoid NSAIDs at this time given patient's history of gastric sleeve.  Amount and/or Complexity of Data Reviewed Radiology: ordered.  Risk Prescription drug management.        Final diagnoses:  None    ED Discharge Orders     None          Daralene Lonni JONETTA DEVONNA 01/21/24 1138    Melvenia Motto, MD 01/21/24 1503  "

## 2024-01-21 NOTE — Progress Notes (Signed)
 "  Established Patient Office Visit  Subjective   Patient ID: Steven Barrera, male    DOB: 08-26-1995  Age: 29 y.o. MRN: [id number]  Chief Complaint  Patient presents with   Shoulder Pain    Injured his right shoulder in August and has been hurting really bad since. Hurts to lift it and sometimes even when not doing any activity    HPI Discussed the use of AI scribe software for clinical note transcription with the patient, who gave verbal consent to proceed.  History of Present Illness    Steven Barrera is a 29 year old male who presents with shoulder pain.  Shoulder pain and decreased range of motion - Sudden onset of excruciating right shoulder pain after a shoulder-focused workout, followed by sleeping on the affected side - Severe pain upon waking - Received a steroid injection at [facility], which provided relief for approximately one month - Pain has recurred and is now associated with decreased range of motion - Has avoided shoulder workouts for two to three weeks to allow for rest, but pain persists  Medication use - Currently taking potassium, prescribed by a urologist for kidney stones     Patient Active Problem List   Diagnosis Date Noted   Chronic right shoulder pain 01/23/2024   Encounter for routine adult physical exam with abnormal findings 04/22/2022   Nephrolithiasis 09/07/2021   Severe obstructive sleep apnea 01/10/2021   Hypertension complicating diabetes (HCC) 01/04/2020   Morbidly obese (HCC) 01/04/2020   Acute appendicitis 04/06/2015    ROS    Objective:     BP 127/75   Pulse 69   Resp 16   Ht 5' 10 (1.778 m)   Wt 229 lb (103.9 kg)   SpO2 98%   BMI 32.86 kg/m  BP Readings from Last 3 Encounters:  01/21/24 104/70  01/21/24 127/75  08/15/23 136/64   Wt Readings from Last 3 Encounters:  01/21/24 229 lb (103.9 kg)  01/21/24 229 lb (103.9 kg)  08/15/23 250 lb (113.4 kg)     Physical Exam Vitals and nursing note  reviewed.  Constitutional:      Appearance: Normal appearance. He is obese.  HENT:     Head: Normocephalic.     Right Ear: Tympanic membrane, ear canal and external ear normal.     Left Ear: Tympanic membrane, ear canal and external ear normal.     Nose: Nose normal.     Mouth/Throat:     Mouth: Mucous membranes are moist.     Pharynx: Oropharynx is clear.  Eyes:     Extraocular Movements: Extraocular movements intact.     Pupils: Pupils are equal, round, and reactive to light.  Cardiovascular:     Rate and Rhythm: Normal rate and regular rhythm.  Pulmonary:     Effort: Pulmonary effort is normal.     Breath sounds: Normal breath sounds.  Abdominal:     General: Abdomen is flat. Bowel sounds are normal.     Palpations: Abdomen is soft.  Musculoskeletal:     Right shoulder: Tenderness present. Decreased range of motion. Decreased strength.     Left shoulder: Normal.     Cervical back: Normal range of motion and neck supple.  Skin:    General: Skin is warm and dry.  Neurological:     Mental Status: He is alert and oriented to person, place, and time.  Psychiatric:        Mood and Affect: Mood  normal.        Thought Content: Thought content normal.       The ASCVD Risk score (Arnett DK, et al., 2019) failed to calculate for the following reasons:   The 2019 ASCVD risk score is only valid for ages 63 to 18   * - Cholesterol units were assumed    Assessment & Plan:   Problem List Items Addressed This Visit       Genitourinary   Nephrolithiasis   - Sent potassium prescription refill to [facility] on [address].      Relevant Medications   Potassium Citrate  15 MEQ (1620 MG) TBCR     Other   Chronic right shoulder pain - Primary   Pain worsened by recent activity. Previous steroid injection provided temporary relief. Limited range of motion noted. - Ordered right shoulder x-ray. - Referred to orthopedics for further evaluation.      Relevant Orders   DG  Shoulder Right   Ambulatory referral to Orthopedic Surgery    No follow-ups on file.    Leita Longs, FNP  "

## 2024-01-21 NOTE — ED Triage Notes (Signed)
 Pt arrived via POV following PCP appointment this morning for recurrent right shoulder pain that originated this past August from working out. Pt reports previously receiving a steroid shot, and reports it helped for a short while of time.

## 2024-01-23 DIAGNOSIS — G8929 Other chronic pain: Secondary | ICD-10-CM | POA: Insufficient documentation

## 2024-01-23 NOTE — Assessment & Plan Note (Signed)
-   Sent potassium prescription refill to [facility] on [address].

## 2024-01-23 NOTE — Assessment & Plan Note (Signed)
 Pain worsened by recent activity. Previous steroid injection provided temporary relief. Limited range of motion noted. - Ordered right shoulder x-ray. - Referred to orthopedics for further evaluation.

## 2024-01-29 ENCOUNTER — Encounter: Payer: Self-pay | Admitting: Orthopedic Surgery

## 2024-01-29 ENCOUNTER — Ambulatory Visit: Admitting: Orthopedic Surgery

## 2024-01-29 VITALS — BP 104/70 | Ht 70.0 in | Wt 229.0 lb

## 2024-01-29 DIAGNOSIS — M25511 Pain in right shoulder: Secondary | ICD-10-CM | POA: Diagnosis not present

## 2024-01-29 DIAGNOSIS — S46011A Strain of muscle(s) and tendon(s) of the rotator cuff of right shoulder, initial encounter: Secondary | ICD-10-CM

## 2024-01-29 DIAGNOSIS — G8929 Other chronic pain: Secondary | ICD-10-CM

## 2024-01-29 NOTE — Progress Notes (Signed)
 "  Office Visit Note   Patient: Steven Barrera           Date of Birth: 12/01/1995           MRN: [id number] Visit Date: 01/29/2024 Requested by: Terry Wilhelmena Lloyd Hilario, FNP [phone] [address] [place],  [place] [address] PCP: Terry Wilhelmena Lloyd Hilario, FNP   Assessment & Plan:   Encounter Diagnoses  Name Primary?   Chronic right shoulder pain Yes   Traumatic complete tear of right rotator cuff, initial encounter     No orders of the defined types were placed in this encounter.   29 year old male who injured his shoulder in the weight room doing military presses.  Patient has had Tylenol  ibuprofen  cortisone injection still having pain  Recommend MRI to rule out rotator cuff tear   Subjective: Chief Complaint  Patient presents with   Shoulder Pain    Right     HPI: 29 year old male injured his shoulder in August doing a workout for his shoulders.  He was doing shoulder presses felt pain after the workout.  He went to urgent care on 1 occasion he got an injection it appears to be in the subacromial space he took Tylenol  Advil  still having pain in the right shoulder.  The pain is around the deltoid; worsen with forward elevation              ROS: Negative for paresthesias   Images personally read and my interpretation : Imaging was done at the hospital that shows no fracture 3 views were done bone quality looks normal glenohumeral joint looks normal humeral head in normal position  Visit Diagnoses:  1. Chronic right shoulder pain   2. Traumatic complete tear of right rotator cuff, initial encounter      Follow-Up Instructions: Return for FOLLOW UP, MRI RESULTS.    Objective: Vital Signs: BP 104/70 Comment: 01/21/24  Ht 5' 10 (1.778 m)   Wt 229 lb (103.9 kg)   BMI 32.86 kg/m   Physical Exam Vitals and nursing note reviewed.  Constitutional:      General: He is not in acute distress.    Appearance: Normal appearance. He is not ill-appearing, toxic-appearing  or diaphoretic.  HENT:     Head: Normocephalic and atraumatic.     Nose: Nose normal. No congestion or rhinorrhea.  Eyes:     General: No scleral icterus.       Right eye: No discharge.        Left eye: No discharge.     Extraocular Movements: Extraocular movements intact.     Conjunctiva/sclera: Conjunctivae normal.     Pupils: Pupils are equal, round, and reactive to light.  Cardiovascular:     Pulses: Normal pulses.  Pulmonary:     Effort: Pulmonary effort is normal.     Breath sounds: No wheezing.  Skin:    General: Skin is warm and dry.     Capillary Refill: Capillary refill takes less than 2 seconds.     Coloration: Skin is not jaundiced.     Findings: No erythema.  Neurological:     General: No focal deficit present.     Mental Status: He is alert and oriented to person, place, and time.  Psychiatric:        Mood and Affect: Mood normal.        Behavior: Behavior normal.        Thought Content: Thought content normal.  Judgment: Judgment normal.      Right Shoulder Exam   Tenderness  Right shoulder tenderness location: Rotator interval.  Range of Motion  Active abduction:  abnormal  Passive abduction:  abnormal  Extension:  normal  External rotation:  normal  Forward flexion:  abnormal  Internal rotation 0 degrees:  normal  Internal rotation 90 degrees:  abnormal   Muscle Strength  Abduction: 4/5  Internal rotation: 5/5  External rotation: 5/5  Supraspinatus: 4/5  Subscapularis: 5/5  Biceps: 5/5   Tests  Apprehension: negative Hawkins test: negative Cross arm: negative Impingement: negative Drop arm: negative Sulcus: absent  Other  Erythema: absent Scars: absent Sensation: normal Pulse: present       Specialty Comments:  No specialty comments available.  Imaging: No results found.   PMFS History: Patient Active Problem List   Diagnosis Date Noted   Chronic right shoulder pain 01/23/2024   Encounter for routine adult  physical exam with abnormal findings 04/22/2022   Nephrolithiasis 09/07/2021   Severe obstructive sleep apnea 01/10/2021   Hypertension complicating diabetes (HCC) 01/04/2020   Morbidly obese (HCC) 01/04/2020   Acute appendicitis 04/06/2015   Past Medical History:  Diagnosis Date   Childhood asthma    Diabetes mellitus without complication (HCC)    Kidney stone     Family History  Problem Relation Age of Onset   Kidney failure Maternal Grandmother    Kidney failure Other     Past Surgical History:  Procedure Laterality Date   APPENDECTOMY  04/06/2015   LAPAROSCOPIC APPENDECTOMY N/A 04/06/2015   Procedure: APPENDECTOMY LAPAROSCOPIC;  Surgeon: Lynwood Pina, MD;  Location: [facility] OR;  Service: General;  Laterality: N/A;   TENDON REPAIR Right 07/31/2014   Procedure: WOUND EXPLORATION WITH EXTENSIVE TENDON REPAIR OF RIGHT INDEX FINGER;  Surgeon: Balinda Rogue, MD;  Location: [facility] OR;  Service: Plastics;  Laterality: Right;   Social History   The patient's 5-year-old son has had a traumatic brain injury   Occupational History   Not on file  Tobacco Use   Smoking status: Some Days    Types: Cigars, E-cigarettes   Smokeless tobacco: Never  Vaping Use   Vaping status: Some Days  Substance and Sexual Activity   Alcohol use: Yes    Comment: 04/06/2015 I'll drink on holidays/birthdays   Drug use: No   Sexual activity: Yes       "

## 2024-01-29 NOTE — Progress Notes (Signed)
" °  Intake history:  Chief Complaint  Patient presents with   Shoulder Pain    Right      BP 104/70 Comment: 01/21/24  Ht 5' 10 (1.778 m)   Wt 229 lb (103.9 kg)   BMI 32.86 kg/m  Body mass index is 32.86 kg/m.  Pharmacy? _____[facility]_________________________________  WHAT ARE WE SEEING YOU FOR TODAY?   Right shoulder  How long has this bothered you? (DOI?DOS?WS?)  on 01/21/24 ER pain since around August   Was there an injury? No but started with weight lifting   Anticoag.  No   Any ALLERGIES ______Allergies[1] ________________________________________   Treatment:  Have you taken:  Tylenol  yes but not currently  Advil  yes but not currently  Had PT No  Had injection Yes at Urgent care   Other  _________________________        [1]  Allergies Allergen Reactions   Shellfish Allergy Swelling    Facial swelling   "

## 2024-01-29 NOTE — Patient Instructions (Addendum)
 For pain try tylenol  500 mg every 6 hrs as needed   And Ibuprofen  800 mg every 8 hrs as needed   While we are working on your approval for MRI please go ahead and call to schedule your appointment with [facility] within at least one (1) week.   Central Scheduling [phone]   Medicaid will probably need physical therapy first, we will call you when we get the denial

## 2024-01-30 ENCOUNTER — Ambulatory Visit (HOSPITAL_COMMUNITY)
Admission: RE | Admit: 2024-01-30 | Discharge: 2024-01-30 | Disposition: A | Source: Ambulatory Visit | Attending: Orthopedic Surgery | Admitting: Orthopedic Surgery

## 2024-01-30 DIAGNOSIS — G8929 Other chronic pain: Secondary | ICD-10-CM | POA: Insufficient documentation

## 2024-01-30 DIAGNOSIS — M25511 Pain in right shoulder: Secondary | ICD-10-CM | POA: Insufficient documentation

## 2024-02-12 ENCOUNTER — Ambulatory Visit: Admitting: Orthopedic Surgery

## 2024-02-12 DIAGNOSIS — M7581 Other shoulder lesions, right shoulder: Secondary | ICD-10-CM

## 2024-02-12 MED ORDER — METHYLPREDNISOLONE ACETATE 40 MG/ML IJ SUSP
40.0000 mg | Freq: Once | INTRAMUSCULAR | Status: AC
  Administered 2024-02-12: 40 mg via INTRA_ARTICULAR

## 2024-02-12 NOTE — Progress Notes (Signed)
 MRI RESULTS FOLLOW UP   Encounter Diagnosis  Name Primary?   Rotator cuff tendinitis, right Yes    Chief Complaint  Patient presents with   Shoulder Pain    R states has a little more pain tried to work out after seeing results of MRI.     Shoulder Pain    29 year old male injured his shoulder doing military presses in the weight room initial treatment with ibuprofen  Tylenol  and cortisone injection but still had pain so we sent him for an MRI to rule out a rotator cuff tear.  Symptoms started in August 2025.   + EXAM FINDINGS: He still has good strength in his shoulder some irritation when we move the shoulder around no real loss of motion  MY READING OF THE MRI I do not see a rotator cuff tear he has a lot of irritation around the shoulder joint with a tendinopathy in the cuff biceps tendon and may be some irritation of the labrum  MRI REPORT:   IMPRESSION: 1.  Mild tendinopathy throughout the distal rotator cuff tendons without tear. 2.  Mild tendinopathy of intra-articular biceps tendon without tear. 3.  Small to moderate amount of subacromial, subdeltoid fluid. 4. 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   Electronically signed by: Venetia Neer MD 01/30/2024 06:27 PM EST RP Workstation: [id number]    ASSESSMENT AND PLAN :   29 year old male with no evidence of frank cuff tear right shoulder.  I am just not clear that he has a operable shoulder problem  Recommend  subacromial injection   physical therapy   recheck 6 weeks    Procedure note for injection   Chief Complaint  Patient presents with   Shoulder Pain    R states has a little more pain tried to work out after seeing results of MRI.      Encounter Diagnosis  Name Primary?   Rotator cuff tendinitis, right Yes        The patient has consented for injection of the subacromial space right shoulder  Medication:  Depo-Medrol  40 mg and lidocaine  1%  Time out completed: Yes  The site of injection was cleaned with alcohol and ethyl chloride.  The injection was given without any complications appropriate precautions were given.

## 2024-02-12 NOTE — Progress Notes (Signed)
" ° ° °  02/12/2024   Chief Complaint  Patient presents with   Shoulder Pain    R states has a little more pain tried to work out after seeing results of MRI.     No diagnosis found.  What pharmacy do you use ? [facility]  DOI/DOS/ Date:    Did you get better, worse or no change (Answer below)   Unchanged      "

## 2024-02-12 NOTE — Patient Instructions (Addendum)
 Physical therapy has been ordered for you at [facility]. They should call you to schedule, [phone] is the phone number to call, if you want to call to schedule.   I think about tendinitis of the shoulder  I put a cortisone injection around the tendon to try to relieve pain and started on a physical therapy program to rehab the muscles around the shoulder  Follow-up in 6 weeks and we will look at things again

## 2024-03-08 ENCOUNTER — Ambulatory Visit: Payer: Self-pay

## 2024-03-09 ENCOUNTER — Ambulatory Visit (HOSPITAL_COMMUNITY): Admitting: Occupational Therapy

## 2024-03-25 ENCOUNTER — Ambulatory Visit: Admitting: Orthopedic Surgery
